# Patient Record
Sex: Female | Born: 1985 | Race: White | Hispanic: No | Marital: Single | Smoking: Never smoker
Health system: Southern US, Community
[De-identification: ages and names within clinical notes are randomized; demographics above are authoritative.]

## PROBLEM LIST (undated history)

## (undated) DIAGNOSIS — K589 Irritable bowel syndrome without diarrhea: Secondary | ICD-10-CM

## (undated) DIAGNOSIS — R519 Headache, unspecified: Secondary | ICD-10-CM

## (undated) DIAGNOSIS — F419 Anxiety disorder, unspecified: Secondary | ICD-10-CM

## (undated) DIAGNOSIS — I4581 Long QT syndrome: Secondary | ICD-10-CM

## (undated) DIAGNOSIS — E282 Polycystic ovarian syndrome: Secondary | ICD-10-CM

## (undated) DIAGNOSIS — I4719 Other supraventricular tachycardia: Secondary | ICD-10-CM

## (undated) HISTORY — DX: Irritable bowel syndrome, unspecified: K58.9

## (undated) HISTORY — DX: Anxiety disorder, unspecified: F41.9

## (undated) HISTORY — DX: Headache, unspecified: R51.9

## (undated) HISTORY — PX: LOOP RECORDER INSERTION: EP1214

## (undated) HISTORY — PX: OTHER SURGICAL HISTORY: SHX169

## (undated) HISTORY — DX: Other supraventricular tachycardia: I47.19

## (undated) HISTORY — DX: Polycystic ovarian syndrome: E28.2

## (undated) HISTORY — DX: Long QT syndrome: I45.81

---
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
## 2009-07-07 ENCOUNTER — Telehealth (INDEPENDENT_AMBULATORY_CARE_PROVIDER_SITE_OTHER): Payer: Self-pay | Admitting: *Deleted

## 2010-05-09 NOTE — Progress Notes (Signed)
  I faxed pt a ROI 3/30 she signed faxed back to me, I copied records called and let her know they were ready (12 lead,LOV) she will pick-up. Rebekah [name]  July 07, 2009 8:31 AM

## 2010-11-24 ENCOUNTER — Other Ambulatory Visit: Payer: Self-pay | Admitting: Family Medicine

## 2010-11-24 DIAGNOSIS — R51 Headache: Secondary | ICD-10-CM

## 2010-11-29 ENCOUNTER — Other Ambulatory Visit: Payer: Self-pay | Admitting: Family Medicine

## 2010-11-29 DIAGNOSIS — R51 Headache: Secondary | ICD-10-CM

## 2010-12-04 ENCOUNTER — Ambulatory Visit
Admission: RE | Admit: 2010-12-04 | Discharge: 2010-12-04 | Disposition: A | Discharge status: Home / Self Care | Payer: 59 | Source: Ambulatory Visit | Attending: Family Medicine | Admitting: Family Medicine

## 2010-12-04 DIAGNOSIS — R51 Headache: Secondary | ICD-10-CM

## 2010-12-04 MED ORDER — IOHEXOL 300 MG/ML  SOLN
75.0000 mL | Freq: Once | INTRAMUSCULAR | Status: AC | PRN
  Administered 2010-12-04: 75 mL via INTRAVENOUS

## 2010-12-15 ENCOUNTER — Other Ambulatory Visit: Payer: Self-pay | Admitting: Family Medicine

## 2010-12-15 DIAGNOSIS — R1013 Epigastric pain: Secondary | ICD-10-CM

## 2010-12-20 ENCOUNTER — Ambulatory Visit
Admission: RE | Admit: 2010-12-20 | Discharge: 2010-12-20 | Disposition: A | Discharge status: Home / Self Care | Payer: 59 | Source: Ambulatory Visit | Attending: Family Medicine | Admitting: Family Medicine

## 2010-12-20 DIAGNOSIS — R1013 Epigastric pain: Secondary | ICD-10-CM

## 2012-08-04 IMAGING — CT CT HEAD WO/W CM
1 of 2 series · 13 of 30 positions shown, 17 images · IV contrast (75CC OMNI 300)
Comparison: None

CLINICAL DATA: Frequent headaches.

CT HEAD WITHOUT AND WITH CONTRAST
TECHNIQUE: Contiguous axial images were obtained from the base of
the skull through the vertex without and with intravenous contrast.
Contrast: 75 ml [id number]

[Series 2: head w/o · axial · non-contrast · 0.49mm/px · z∈[+16,+139]mm · 13 of 28 slices shown, 17 images]
[im 2/28  brain]
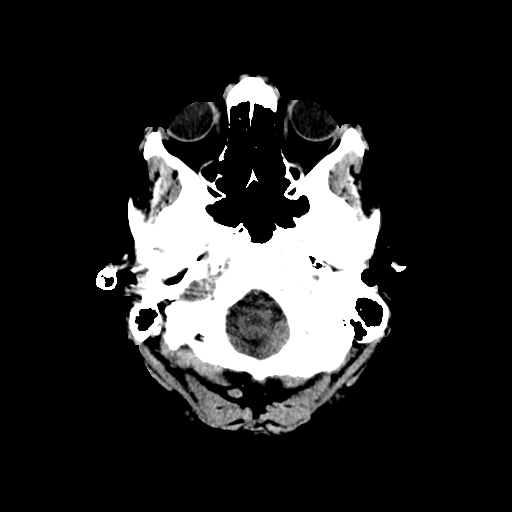
[im 2/28  bone]
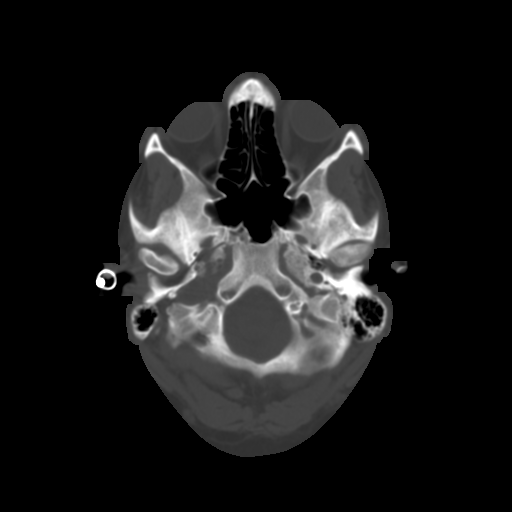
[im 4/28  brain]
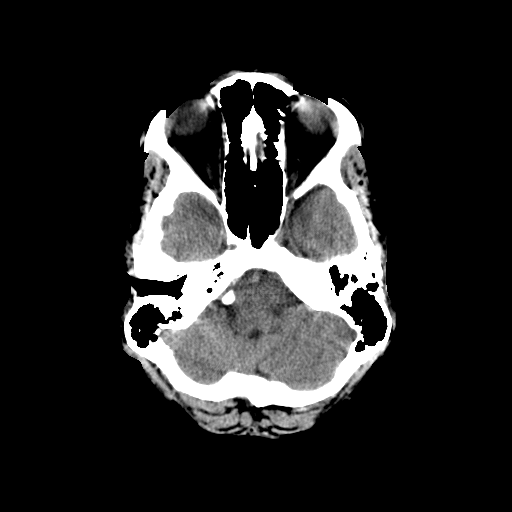
[im 6/28  brain]
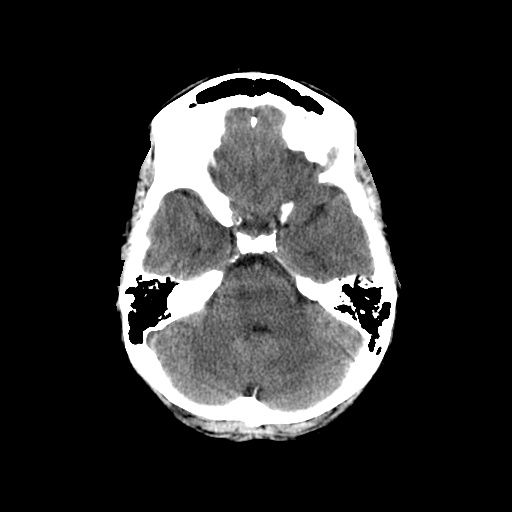
[im 8/28  brain]
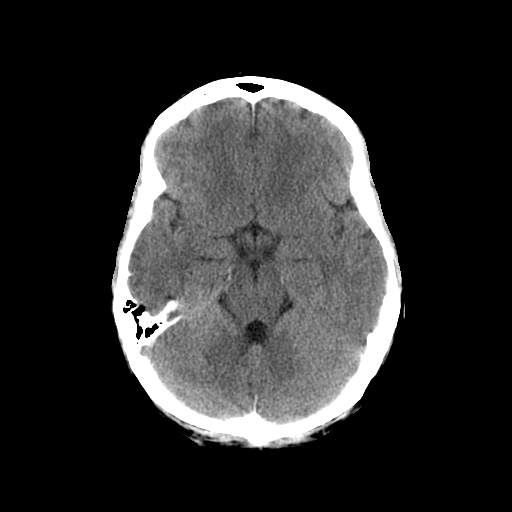
[im 10/28  brain]
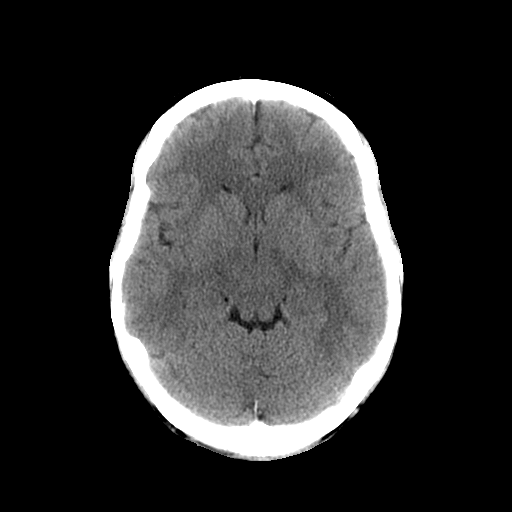
[im 10/28  bone]
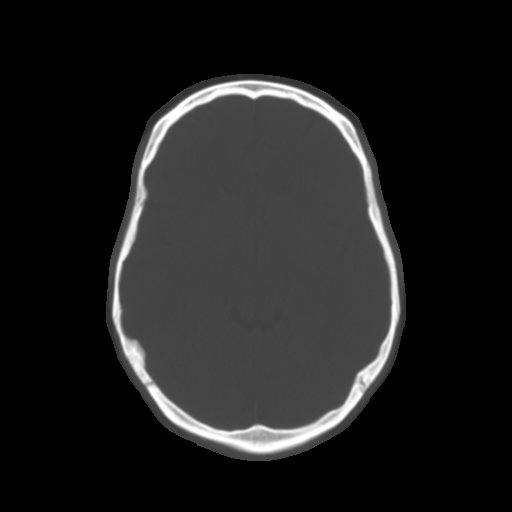
[im 12/28  brain]
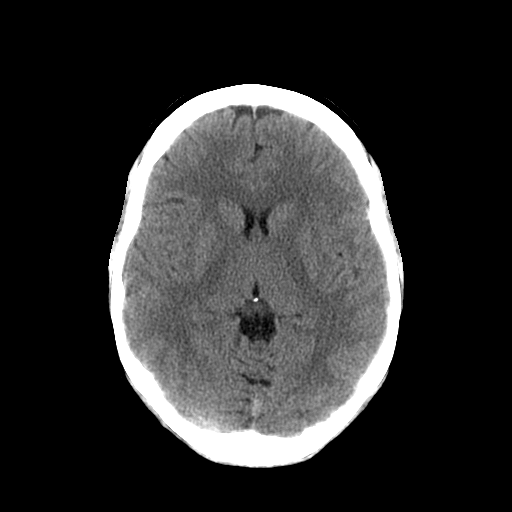
[im 14/28  brain]
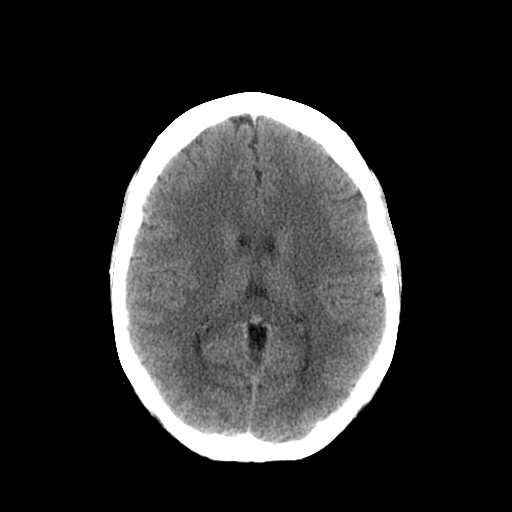
[im 16/28  brain]
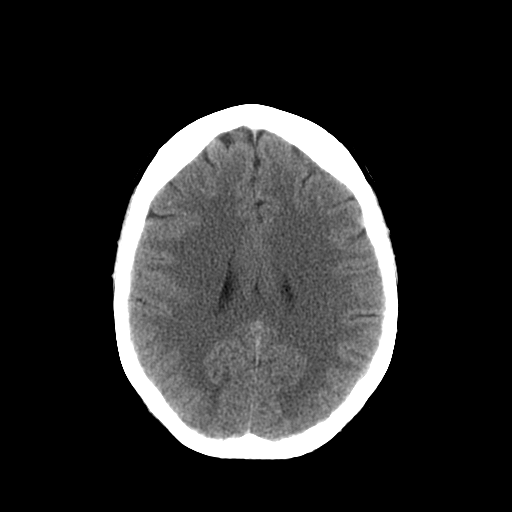
[im 18/28  brain]
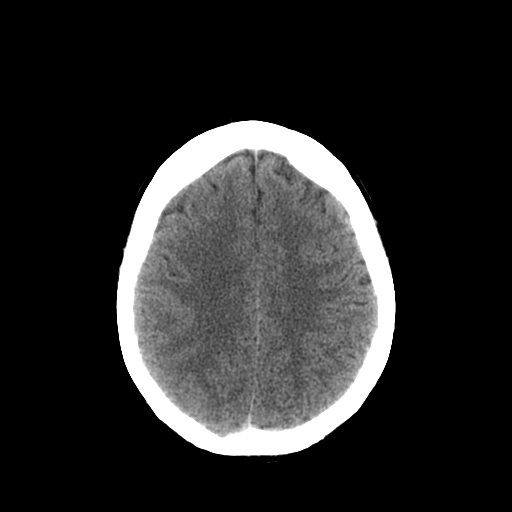
[im 18/28  bone]
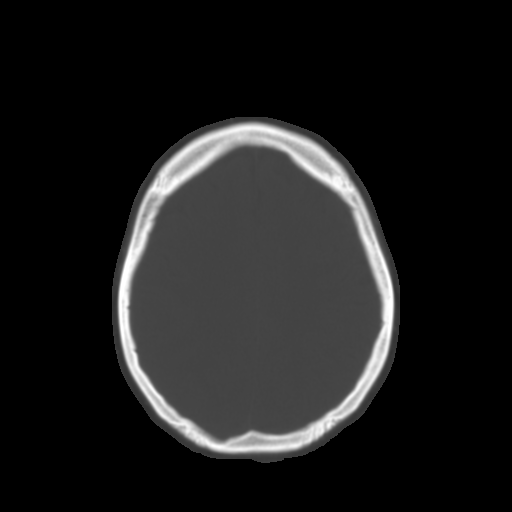
[im 20/28  brain]
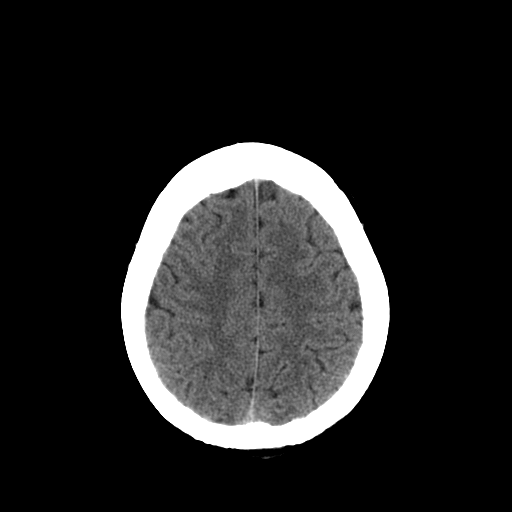
[im 22/28  brain]
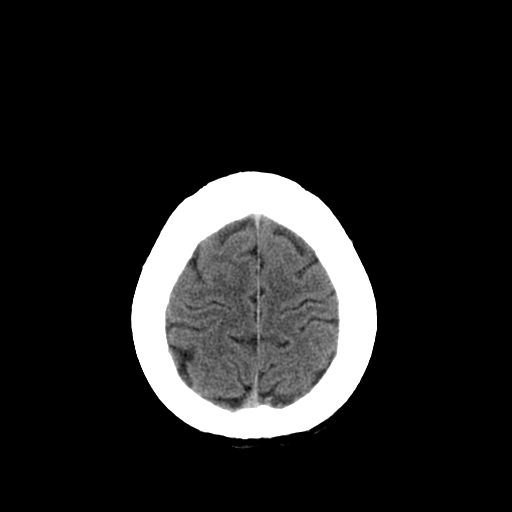
[im 24/28  brain]
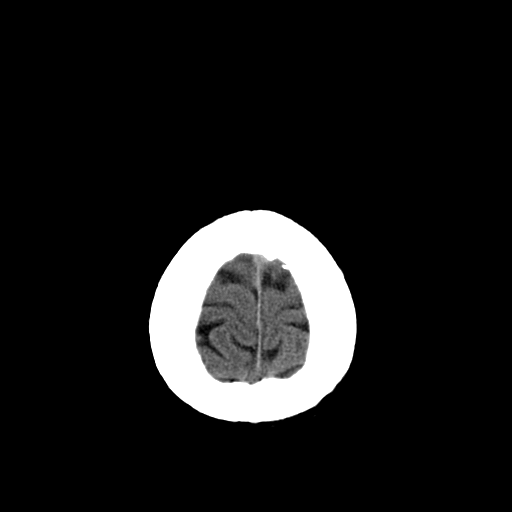
[im 26/28  brain]
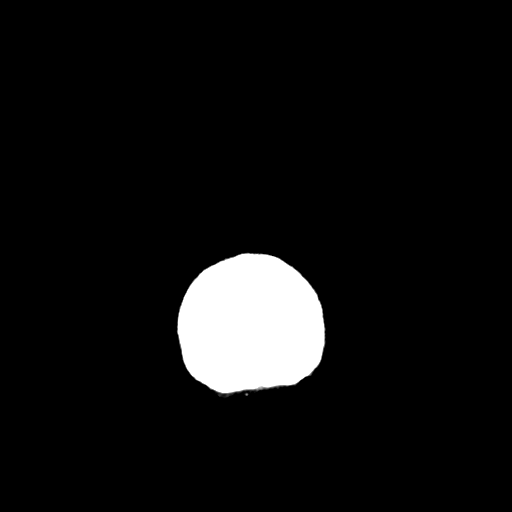
[im 26/28  bone]
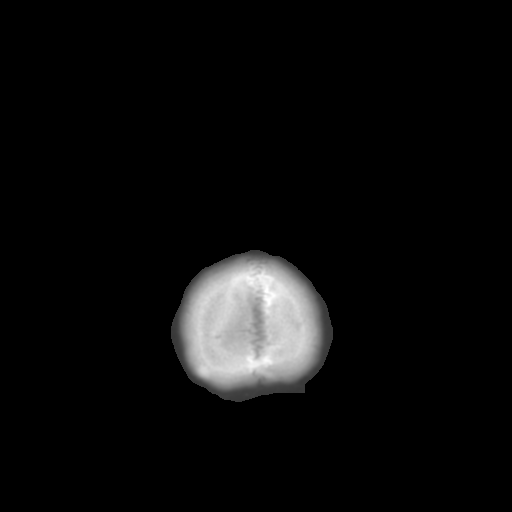

[13 of 30 positions shown; findings below may reference images not displayed]

FINDINGS: The ventricles are normal.  No extra-axial fluid
collections are seen.  The brainstem and cerebellum are
unremarkable.  No acute intracranial findings such as infarction or
hemorrhage.  No mass lesions.The major vascular structures appear
patent and normal including the dural venous sinuses.

The bony calvarium is intact.  The visualized paranasal sinuses and
mastoid air cells are clear.
IMPRESSION: Normal head CT.

## 2012-08-20 IMAGING — US US ABDOMEN COMPLETE
1 series · 14 of 25 positions shown · non-contrast
Comparison: None.

CLINICAL DATA: Dyspepsia.  Epigastric pain with nausea, vomiting
and diarrhea.

COMPLETE ABDOMINAL ULTRASOUND

[Series 1: us abdomen complete · 0.29mm/px · 14 of 86 slices shown]
[im 1/86]
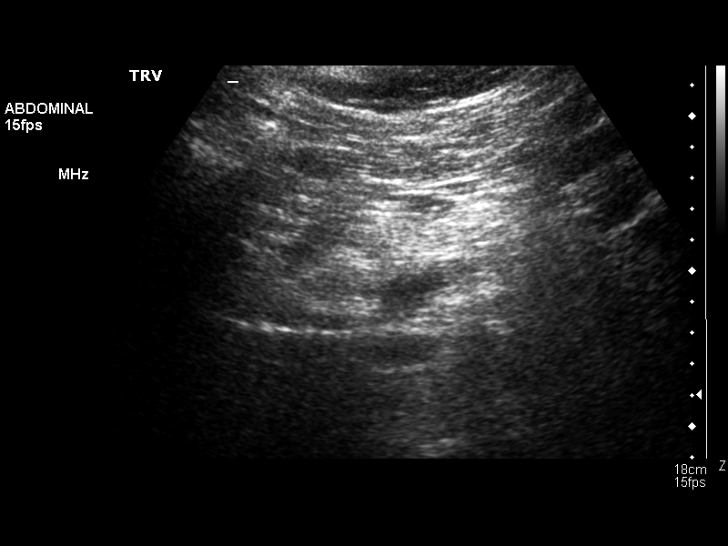
[im 8/86]
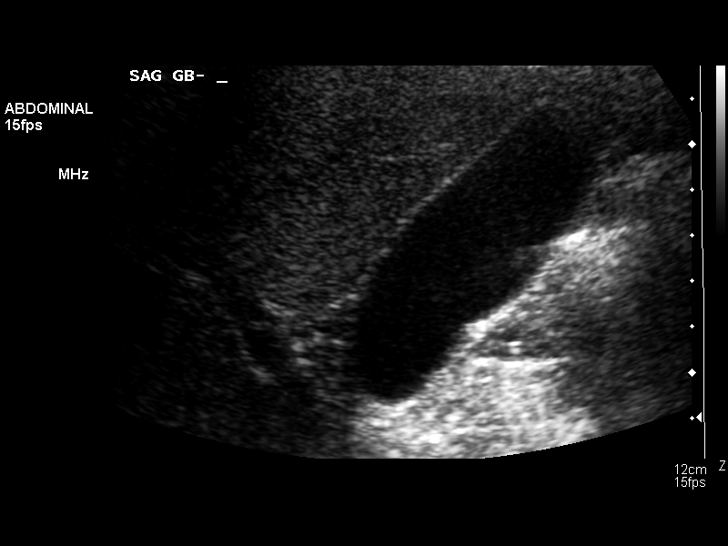
[im 15/86]
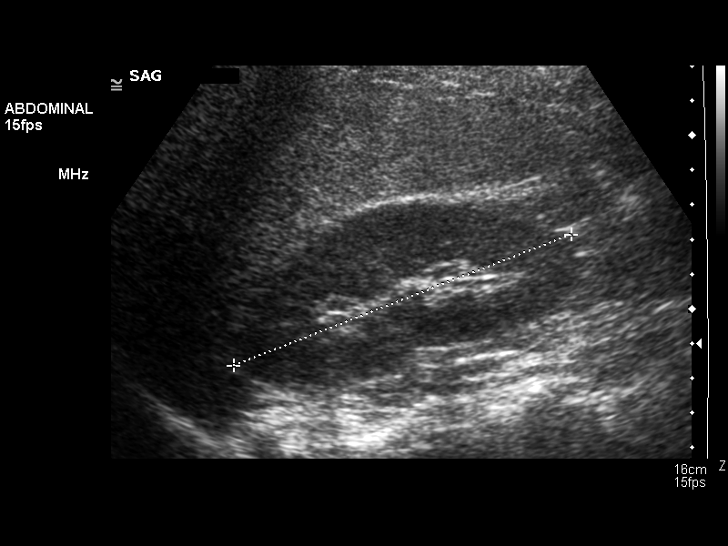
[im 22/86]
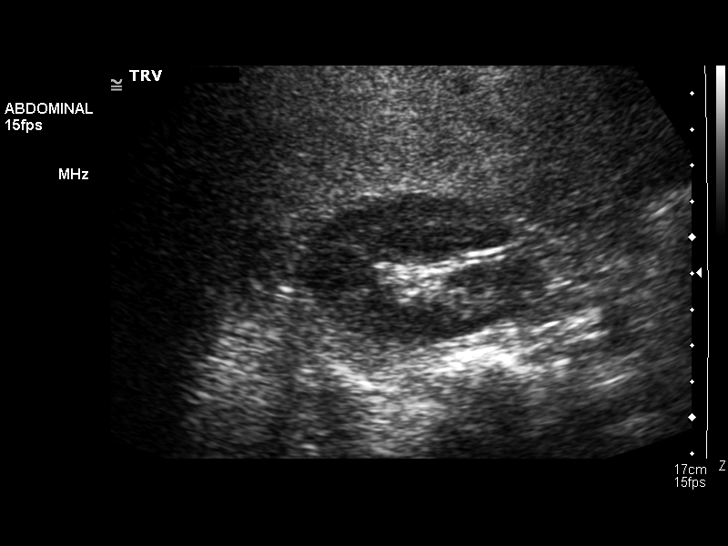
[im 29/86]
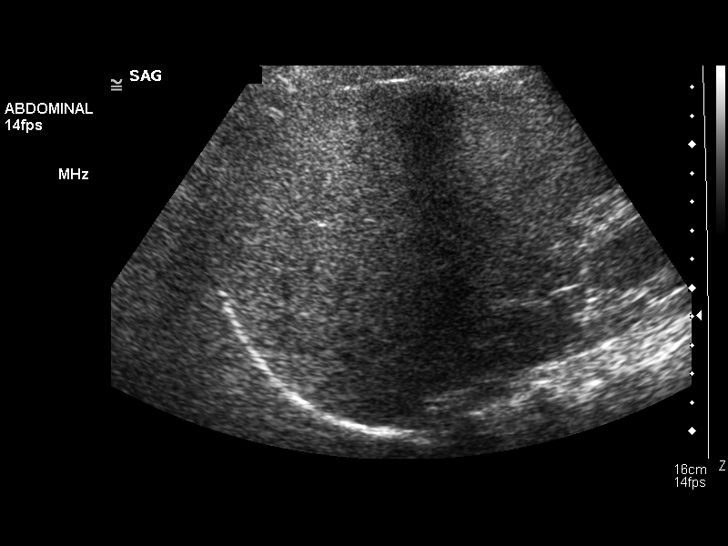
[im 32/86]
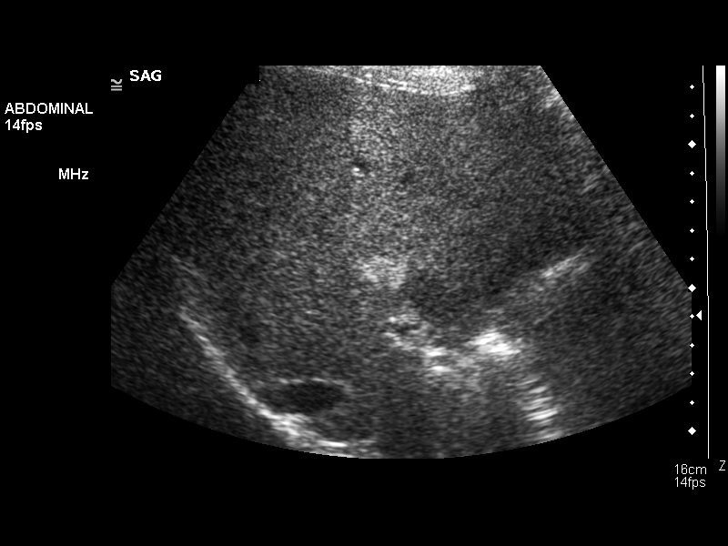
[im 39/86]
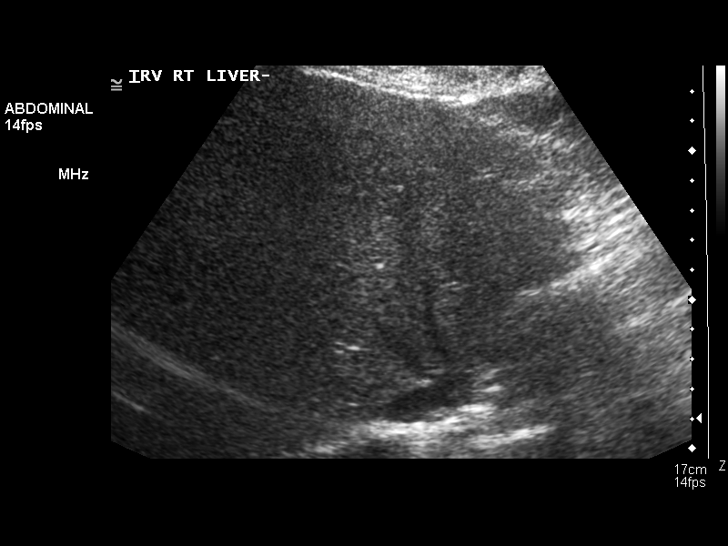
[im 47/86]
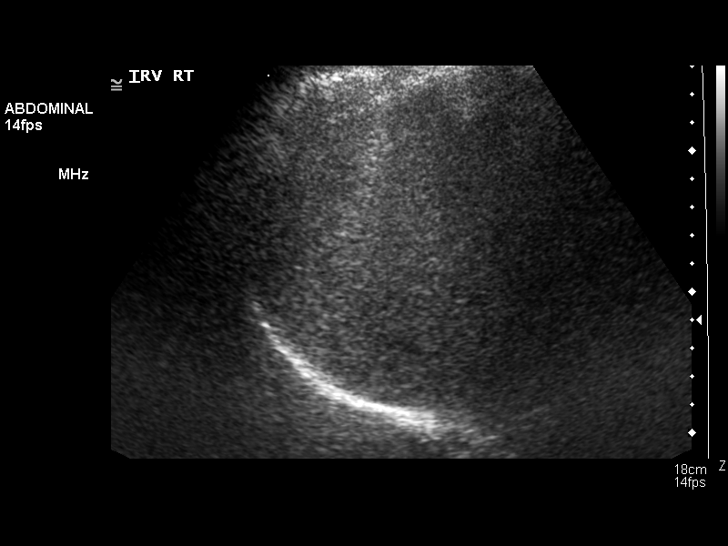
[im 54/86]
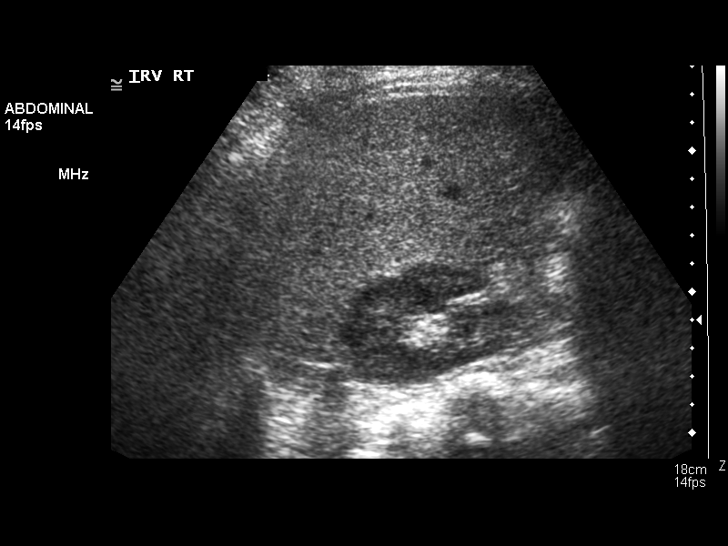
[im 57/86]
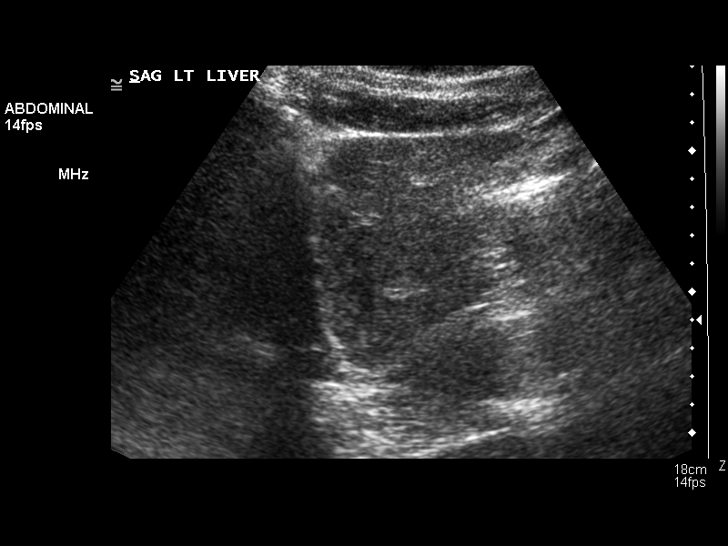
[im 64/86]
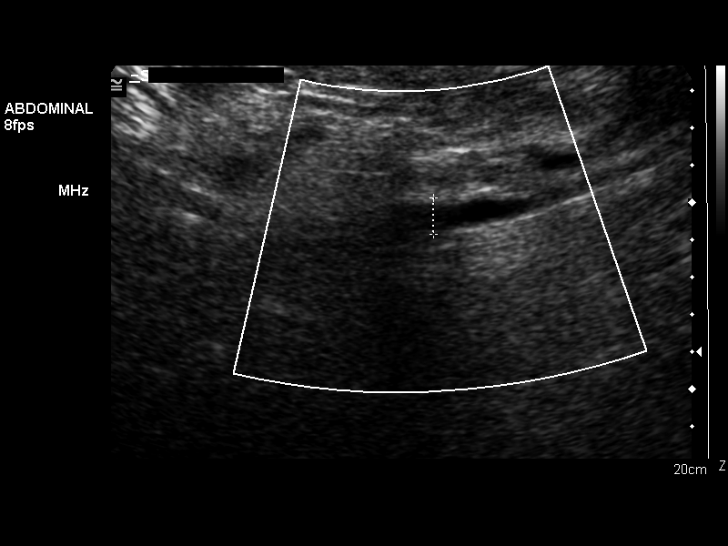
[im 71/86]
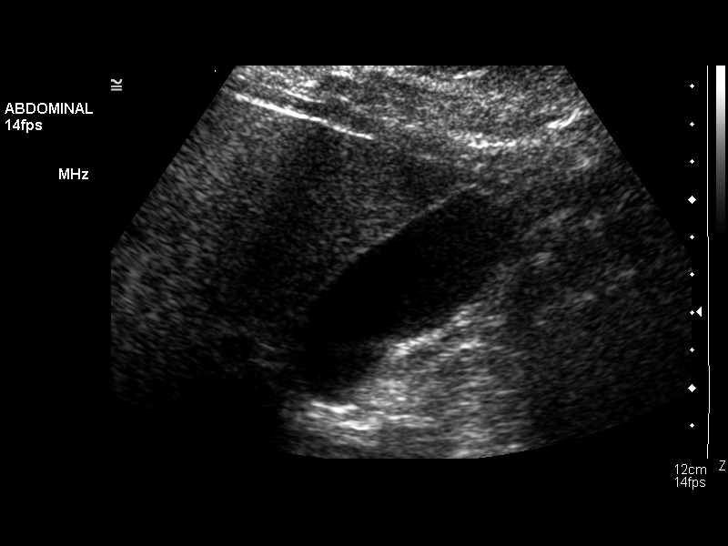
[im 78/86]
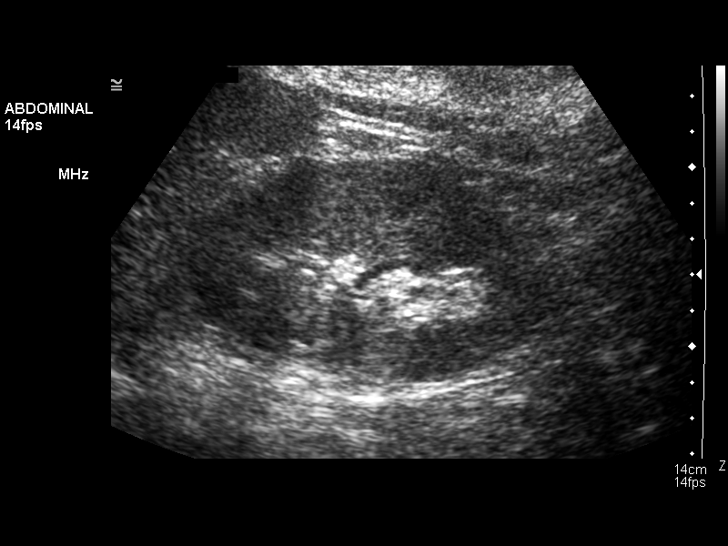
[im 86/86]
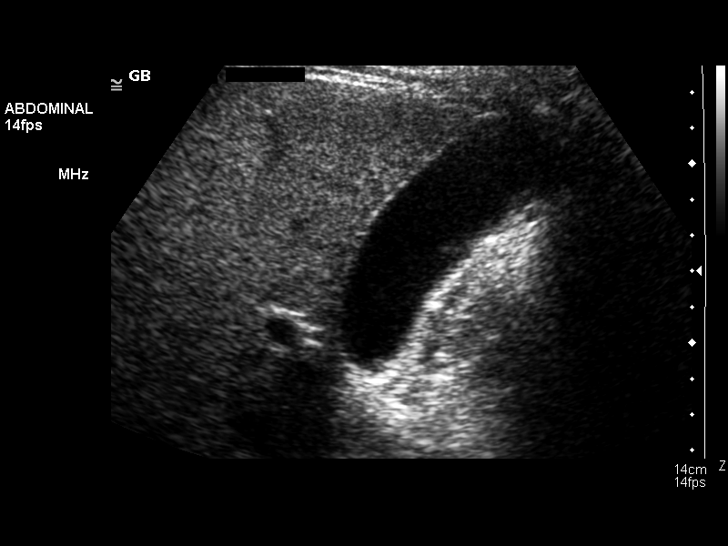

[14 of 25 positions shown; findings below may reference images not displayed]

FINDINGS: Gallbladder:  No gallstones, gallbladder wall thickening, or
pericholecystic fluid.

Common bile duct:  Measures 3 mm, within normal limits.

Liver:  Heterogeneous in echotexture.  In the right hepatic lobe,
there is a 1.8 x 1.2 x 2.2 cm hyperechoic lesion.  A focal area of
decreased echogenicity in the right hepatic lobe measures 2.2 cm
(image 50).

IVC:  Appears normal.

Pancreas:  Visualization is limited by bowel gas.

Spleen:  Measures 9.7 cm, negative.

Right Kidney:  Measures 11.0 cm with uniform parenchymal echo
texture.  No hydronephrosis.

Left Kidney:  Measures 12.0 cm with uniform parenchymal echo
texture.  No hydronephrosis.

Abdominal aorta:  Proximal aorta and common iliac arteries are
obscured by bowel gas.  Otherwise nonaneurysmal.
IMPRESSION: Hepatic steatosis with a probable right hepatic lobe hemangioma and
focal area of fatty sparing.

## 2014-09-13 ENCOUNTER — Other Ambulatory Visit (HOSPITAL_COMMUNITY)
Admission: RE | Admit: 2014-09-13 | Discharge: 2014-09-13 | Disposition: A | Discharge status: Home / Self Care | Payer: BLUE CROSS/BLUE SHIELD | Source: Ambulatory Visit | Attending: Family Medicine | Admitting: Family Medicine

## 2014-09-13 ENCOUNTER — Other Ambulatory Visit: Payer: Self-pay | Admitting: Family Medicine

## 2014-09-13 DIAGNOSIS — Z124 Encounter for screening for malignant neoplasm of cervix: Secondary | ICD-10-CM | POA: Insufficient documentation

## 2014-09-16 LAB — CYTOLOGY - PAP

## 2014-12-24 ENCOUNTER — Ambulatory Visit: Payer: BLUE CROSS/BLUE SHIELD

## 2018-12-29 ENCOUNTER — Other Ambulatory Visit (HOSPITAL_COMMUNITY)
Admission: RE | Admit: 2018-12-29 | Discharge: 2018-12-29 | Disposition: A | Discharge status: Home / Self Care | Payer: No Typology Code available for payment source | Source: Ambulatory Visit | Attending: Family Medicine | Admitting: Family Medicine

## 2018-12-29 ENCOUNTER — Other Ambulatory Visit: Payer: Self-pay | Admitting: Family Medicine

## 2018-12-29 DIAGNOSIS — Z124 Encounter for screening for malignant neoplasm of cervix: Secondary | ICD-10-CM | POA: Diagnosis present

## 2019-01-01 LAB — CYTOLOGY - PAP: Diagnosis: NEGATIVE

## 2021-02-07 ENCOUNTER — Other Ambulatory Visit: Payer: Self-pay | Admitting: Family Medicine

## 2021-02-07 DIAGNOSIS — N6321 Unspecified lump in the left breast, upper outer quadrant: Secondary | ICD-10-CM

## 2021-02-10 ENCOUNTER — Ambulatory Visit
Admission: RE | Admit: 2021-02-10 | Discharge: 2021-02-10 | Disposition: A | Discharge status: Home / Self Care | Payer: No Typology Code available for payment source | Source: Ambulatory Visit | Attending: Family Medicine | Admitting: Family Medicine

## 2021-02-10 ENCOUNTER — Other Ambulatory Visit: Payer: Self-pay

## 2021-02-10 DIAGNOSIS — N6321 Unspecified lump in the left breast, upper outer quadrant: Secondary | ICD-10-CM

## 2021-10-03 LAB — OB RESULTS CONSOLE RPR: RPR: NONREACTIVE

## 2021-10-03 LAB — OB RESULTS CONSOLE HIV ANTIBODY (ROUTINE TESTING): HIV: NONREACTIVE

## 2021-10-03 LAB — OB RESULTS CONSOLE HEPATITIS B SURFACE ANTIGEN: Hepatitis B Surface Ag: NEGATIVE

## 2021-10-03 LAB — OB RESULTS CONSOLE RUBELLA ANTIBODY, IGM: Rubella: IMMUNE

## 2021-10-04 LAB — OB RESULTS CONSOLE GC/CHLAMYDIA: Chlamydia: NEGATIVE

## 2022-01-23 ENCOUNTER — Encounter: Payer: Self-pay | Admitting: *Deleted

## 2022-01-23 ENCOUNTER — Other Ambulatory Visit: Payer: Self-pay | Admitting: Obstetrics and Gynecology

## 2022-01-23 DIAGNOSIS — Z363 Encounter for antenatal screening for malformations: Secondary | ICD-10-CM

## 2022-01-24 ENCOUNTER — Encounter: Payer: Self-pay | Admitting: *Deleted

## 2022-01-24 ENCOUNTER — Ambulatory Visit: Payer: PRIVATE HEALTH INSURANCE | Attending: Obstetrics and Gynecology

## 2022-01-24 ENCOUNTER — Ambulatory Visit: Payer: PRIVATE HEALTH INSURANCE | Admitting: *Deleted

## 2022-01-24 ENCOUNTER — Other Ambulatory Visit: Payer: Self-pay | Admitting: *Deleted

## 2022-01-24 DIAGNOSIS — Z3A26 26 weeks gestation of pregnancy: Secondary | ICD-10-CM | POA: Insufficient documentation

## 2022-01-24 DIAGNOSIS — Z363 Encounter for antenatal screening for malformations: Secondary | ICD-10-CM | POA: Diagnosis not present

## 2022-01-24 DIAGNOSIS — O368320 Maternal care for abnormalities of the fetal heart rate or rhythm, second trimester, not applicable or unspecified: Secondary | ICD-10-CM | POA: Insufficient documentation

## 2022-01-24 DIAGNOSIS — O99212 Obesity complicating pregnancy, second trimester: Secondary | ICD-10-CM | POA: Insufficient documentation

## 2022-01-24 DIAGNOSIS — O09512 Supervision of elderly primigravida, second trimester: Secondary | ICD-10-CM

## 2022-01-24 DIAGNOSIS — O09522 Supervision of elderly multigravida, second trimester: Secondary | ICD-10-CM | POA: Insufficient documentation

## 2022-01-24 DIAGNOSIS — O09812 Supervision of pregnancy resulting from assisted reproductive technology, second trimester: Secondary | ICD-10-CM | POA: Insufficient documentation

## 2022-01-24 DIAGNOSIS — Z95818 Presence of other cardiac implants and grafts: Secondary | ICD-10-CM

## 2022-01-24 DIAGNOSIS — I4719 Other supraventricular tachycardia: Secondary | ICD-10-CM

## 2022-02-21 ENCOUNTER — Ambulatory Visit: Payer: PRIVATE HEALTH INSURANCE | Attending: Maternal & Fetal Medicine

## 2022-02-21 ENCOUNTER — Ambulatory Visit: Payer: PRIVATE HEALTH INSURANCE | Admitting: *Deleted

## 2022-02-21 ENCOUNTER — Other Ambulatory Visit: Payer: Self-pay | Admitting: *Deleted

## 2022-02-21 VITALS — BP 136/83 | HR 87

## 2022-02-21 DIAGNOSIS — O2693 Pregnancy related conditions, unspecified, third trimester: Secondary | ICD-10-CM

## 2022-02-21 DIAGNOSIS — O09813 Supervision of pregnancy resulting from assisted reproductive technology, third trimester: Secondary | ICD-10-CM

## 2022-02-21 DIAGNOSIS — Z3A30 30 weeks gestation of pregnancy: Secondary | ICD-10-CM | POA: Insufficient documentation

## 2022-02-21 DIAGNOSIS — O368390 Maternal care for abnormalities of the fetal heart rate or rhythm, unspecified trimester, not applicable or unspecified: Secondary | ICD-10-CM

## 2022-02-21 DIAGNOSIS — Z3689 Encounter for other specified antenatal screening: Secondary | ICD-10-CM

## 2022-02-21 DIAGNOSIS — I4719 Other supraventricular tachycardia: Secondary | ICD-10-CM

## 2022-02-21 DIAGNOSIS — O09512 Supervision of elderly primigravida, second trimester: Secondary | ICD-10-CM | POA: Diagnosis not present

## 2022-02-21 DIAGNOSIS — O09523 Supervision of elderly multigravida, third trimester: Secondary | ICD-10-CM

## 2022-02-21 DIAGNOSIS — Z95818 Presence of other cardiac implants and grafts: Secondary | ICD-10-CM

## 2022-02-21 DIAGNOSIS — O99213 Obesity complicating pregnancy, third trimester: Secondary | ICD-10-CM

## 2022-02-21 DIAGNOSIS — O359XX0 Maternal care for (suspected) fetal abnormality and damage, unspecified, not applicable or unspecified: Secondary | ICD-10-CM

## 2022-02-21 DIAGNOSIS — O09513 Supervision of elderly primigravida, third trimester: Secondary | ICD-10-CM | POA: Diagnosis not present

## 2022-02-26 ENCOUNTER — Other Ambulatory Visit: Payer: Self-pay | Admitting: *Deleted

## 2022-02-26 DIAGNOSIS — I4719 Other supraventricular tachycardia: Secondary | ICD-10-CM

## 2022-02-26 DIAGNOSIS — O368390 Maternal care for abnormalities of the fetal heart rate or rhythm, unspecified trimester, not applicable or unspecified: Secondary | ICD-10-CM

## 2022-02-26 DIAGNOSIS — O99213 Obesity complicating pregnancy, third trimester: Secondary | ICD-10-CM

## 2022-02-26 DIAGNOSIS — O09513 Supervision of elderly primigravida, third trimester: Secondary | ICD-10-CM

## 2022-03-06 ENCOUNTER — Ambulatory Visit: Payer: PRIVATE HEALTH INSURANCE | Admitting: *Deleted

## 2022-03-06 ENCOUNTER — Ambulatory Visit (HOSPITAL_BASED_OUTPATIENT_CLINIC_OR_DEPARTMENT_OTHER): Payer: PRIVATE HEALTH INSURANCE | Admitting: Maternal & Fetal Medicine

## 2022-03-06 ENCOUNTER — Encounter: Payer: Self-pay | Admitting: *Deleted

## 2022-03-06 ENCOUNTER — Ambulatory Visit: Payer: PRIVATE HEALTH INSURANCE | Attending: Maternal & Fetal Medicine | Admitting: *Deleted

## 2022-03-06 DIAGNOSIS — O99213 Obesity complicating pregnancy, third trimester: Secondary | ICD-10-CM | POA: Insufficient documentation

## 2022-03-06 DIAGNOSIS — O99413 Diseases of the circulatory system complicating pregnancy, third trimester: Secondary | ICD-10-CM

## 2022-03-06 DIAGNOSIS — O09523 Supervision of elderly multigravida, third trimester: Secondary | ICD-10-CM | POA: Diagnosis not present

## 2022-03-06 DIAGNOSIS — I519 Heart disease, unspecified: Secondary | ICD-10-CM

## 2022-03-06 DIAGNOSIS — E669 Obesity, unspecified: Secondary | ICD-10-CM | POA: Diagnosis present

## 2022-03-06 DIAGNOSIS — Z3A31 31 weeks gestation of pregnancy: Secondary | ICD-10-CM

## 2022-03-06 DIAGNOSIS — O09813 Supervision of pregnancy resulting from assisted reproductive technology, third trimester: Secondary | ICD-10-CM

## 2022-03-06 DIAGNOSIS — O368390 Maternal care for abnormalities of the fetal heart rate or rhythm, unspecified trimester, not applicable or unspecified: Secondary | ICD-10-CM

## 2022-03-06 NOTE — Procedures (Signed)
Rebekah [name] [birth year] [redacted]w[redacted]d  Fetus A Non-Stress Test Interpretation for 03/06/22  Indication:  AMA, IVF, obesity  Fetal Heart Rate A Mode: External Baseline Rate (A): 135 bpm Variability: Moderate Accelerations: 15 x 15 Decelerations: None Multiple birth?: No  Uterine Activity Mode: Toco Contraction Frequency (min): None  Interpretation (Fetal Testing) Nonstress Test Interpretation: Reactive Comments: Dr. Darra Lis reviewed tracing.

## 2022-03-06 NOTE — Progress Notes (Unsigned)
MFM Consult Note Patient Name: Rebekah [name]  Patient MRN:   [id number]  Referring provider: [facility]  Reason for Consult: Maternal prolonged QT, fm hx of sudden death (father, age 35), two second deg relatives with prolonged QT   HPI: Rebekah [name] is a 36 y.o. G1P0 at [redacted]w[redacted]d here for an NST and MFM consult.   [name] had a cardiac workup 12 years ago due to a aunt with long QT and recurrent syncope requiring defib. She has multiple familiy members with her moms side (aunt, second and third cousins) have prolonged QT. They have not had genetic testing. Her father also died in his sleep at age 7 but had no testing. [name] had recurrent syncope in highs school and had a borderline prolonged QT. She was hospitalized in college for a week with atrial ectopia and syncope. She then had an EP study showing an area outside the SA node that was abnormal but is too close to the SA node to ablate. She also had a prolonged QT with epinephrine confirming the diagnosis. She underwent genetic testing and had a variant of unknown significance. She has a loop recorder but no defibrillator. She has not had any episodes of V-tac or V-fib since having this. 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 She has not had an echo in 10 years or so.   Prior to pregnancy she was taking Synthroid at 25 mcg to have a TSH below 2.5 (was 2.53). She has d/c since becoming pregnancy and seeing our office.   Review of Systems: A review of systems was performed and was negative except per HPI   Past Obstetrical History:  OB History  Gravida Para Term Preterm AB Living  1            SAB IAB Ectopic Multiple Live Births               # Outcome Date GA Lbr Len/2nd Weight Sex Delivery Anes PTL Lv  1 Current              Past Gynecologic History:  Not discussed  Past Medical History:  Past Medical History:  Diagnosis Date   Anxiety    Atrial tachycardia     Headache    IBS (irritable bowel syndrome)    Long Q-T syndrome    PCOS (polycystic ovarian syndrome)        Past Surgical History:    Past Surgical History:  Procedure Laterality Date   ivf egg retrieval     LOOP RECORDER INSERTION       Family History:   family history includes Arthritis in her mother; Heart disease in her maternal aunt; Hypertension in her maternal aunt.    Social History:   Social History   Socioeconomic History   Marital status: Single    Spouse name: Not on file   Number of children: Not on file   Years of education: Not on file   Highest education level: Not on file  Occupational History   Not on file  Tobacco Use   Smoking status: Never   Smokeless tobacco: Never  Vaping Use   Vaping Use: Never used  Substance and Sexual Activity   Alcohol use: Not Currently   Drug use: Never   Sexual activity: Not on file  Other Topics Concern   Not on file  Social History Narrative   Not on file  Social Determinants of Health   Financial Resource Strain: Not on file  Food Insecurity: Not on file  Transportation Needs: Not on file  Physical Activity: Not on file  Stress: Not on file  Social Connections: Not on file  Intimate Partner Violence: Not on file      Home Medications:   Current Outpatient Medications on File Prior to Visit  Medication Sig Dispense Refill   aspirin EC 81 MG tablet Take 81 mg by mouth daily. Swallow whole.     famotidine (PEPCID) 40 MG tablet Take 40 mg by mouth daily.     levothyroxine (SYNTHROID) 25 MCG tablet Take 25 mcg by mouth daily before breakfast.     metoprolol tartrate (LOPRESSOR) 50 MG tablet Take 50 mg by mouth 2 (two) times daily.     Prenatal Vit-Fe Fumarate-FA (PRENATAL MULTIVITAMIN) TABS tablet Take 1 tablet by mouth daily at 12 noon.     VITAMIN D PO Take 5,000 Units by mouth daily.     No current facility-administered medications on file prior to visit.      Allergies:   No Known Allergies    Physical Exam:   See intake sheet for vitals Sitting comfortably on the sonogram table Nonlabored breathing Normal rate and rhythm Abdomen is nontender  Assessment  Rebekah [name] is a 35 y.o. G1P0 at [redacted]w[redacted]d by *** with pregnancy complicated by the following conditions:   1. Prolongeed QT syundrome I discussed the clinical implications of prolonged QT syndrome during pregnancy.  Gust the importance of following closely with her OB provider, cardiologist and MFM.  We discussed that serial growth ultrasounds did not.  We also discussed that a baseline echocardiogram is a good idea in the event she were to complications during her labor and delivery.  Also discussed potentially switching from metoprolol to propranolol improved outcomes during pregnancy in patients with a prolonged QT syndrome.  We discussed the timing and route of delivery typically is not altered unless she is having concerning symptoms or episodes of arrhythmia noted on her loop recorder.  At this time she is doing well and I think a vaginal delivery around 39 weeks is appropriate.  We also discussed the importance of meeting with anesthesia for consultation. In order to avoid life-threatening arrhythmias in LQTS patients, sympathetic activity should be minimized. It is reported that this goal can be achieved with combined spinal-epidural anesthesia. Spinal anesthesia alone is not administered for LQTS patients, as it leads to sudden hemodynamic changes and increased risk of arrythmnia. QT-interval prolonging drugs and safety data should be checked carefully during pregnancy. Pregnant women suffering from hyperemesis should be closely monitored for electrolyte imbalance, such as hypokalemia and hypomagnesemia. Usage of antiemetic medications may also prolong the QT interval. In high-risk LQTS (those with symptoms or arrhythmias currently), Caesarean delivery is recommended, whereas in low-risk or medium-risk LQTS, the mode of delivery  is advised by obstetricians. In the absence of obstetric contraindications, vaginal delivery is considered the safest mode of delivery.  Postpartum.  Poses the highest risk for development of an arrhythmia due to the hemodynamic shifts after delivery.  Close monitoring with anesthesia and OB should be done as well as trying to maintain euvolemia normal vital signs.  After delivery the patient's cardiologist has recommended that the newborn undergo an EKG and echocardiogram in the newborn nursery.  Prolonged QT syndrome can be genetically inherited and genetic workup can be considered as well.  Per her report there will be serial outpatient evaluations to  assess the neonatal long QT syndrome.  Recommendations -Serial growth ultrasounds -Fetal echo as been completed and is normal except for periods of bradycardia which can be a sign of fetal prolonged QT -Baseline echo should be done in the event she has a cardiac event for comparison -Continue f/u with cardiology  -Antenatal testing to start around 32 weeks for IVF -Anesthesia consult to be done in the next few weeks, referring provider to order -Consider combined spinal-epidural and maintain euvolemic during labor, delivery and postpartum -Close monitoring of hemodynamics postpartum as this is the highest risk for arrhythmias -Avoid medications that prolong the QT (common ones being antibiotics and antiemetics).  -Delivery around 39 weeks or sooner if indicated -Delivery route should be vaginal unless she develops concerning symptoms during labor, then cesarean would be indicated  Thank you for the opportunity to be involved with this patient's care. Please let us know if we can be of any further assistance.   Valeda Malm  MFM, [facility]   03/06/2022  11:13 AM

## 2022-03-13 ENCOUNTER — Ambulatory Visit: Payer: PRIVATE HEALTH INSURANCE | Admitting: *Deleted

## 2022-03-13 ENCOUNTER — Ambulatory Visit: Payer: PRIVATE HEALTH INSURANCE | Attending: Maternal & Fetal Medicine

## 2022-03-13 ENCOUNTER — Other Ambulatory Visit: Payer: Self-pay | Admitting: *Deleted

## 2022-03-13 VITALS — BP 121/70 | HR 81

## 2022-03-13 DIAGNOSIS — O99413 Diseases of the circulatory system complicating pregnancy, third trimester: Secondary | ICD-10-CM | POA: Diagnosis not present

## 2022-03-13 DIAGNOSIS — Z3A32 32 weeks gestation of pregnancy: Secondary | ICD-10-CM

## 2022-03-13 DIAGNOSIS — O09813 Supervision of pregnancy resulting from assisted reproductive technology, third trimester: Secondary | ICD-10-CM

## 2022-03-13 DIAGNOSIS — I4719 Other supraventricular tachycardia: Secondary | ICD-10-CM | POA: Insufficient documentation

## 2022-03-13 DIAGNOSIS — E669 Obesity, unspecified: Secondary | ICD-10-CM

## 2022-03-13 DIAGNOSIS — O99213 Obesity complicating pregnancy, third trimester: Secondary | ICD-10-CM | POA: Diagnosis present

## 2022-03-13 DIAGNOSIS — O09523 Supervision of elderly multigravida, third trimester: Secondary | ICD-10-CM

## 2022-03-13 DIAGNOSIS — O368390 Maternal care for abnormalities of the fetal heart rate or rhythm, unspecified trimester, not applicable or unspecified: Secondary | ICD-10-CM | POA: Insufficient documentation

## 2022-03-13 DIAGNOSIS — O359XX0 Maternal care for (suspected) fetal abnormality and damage, unspecified, not applicable or unspecified: Secondary | ICD-10-CM

## 2022-03-20 ENCOUNTER — Ambulatory Visit: Payer: PRIVATE HEALTH INSURANCE | Admitting: *Deleted

## 2022-03-20 ENCOUNTER — Ambulatory Visit: Payer: PRIVATE HEALTH INSURANCE | Attending: Maternal & Fetal Medicine

## 2022-03-20 VITALS — BP 139/73 | HR 84

## 2022-03-20 DIAGNOSIS — O09813 Supervision of pregnancy resulting from assisted reproductive technology, third trimester: Secondary | ICD-10-CM | POA: Insufficient documentation

## 2022-03-20 DIAGNOSIS — I4719 Other supraventricular tachycardia: Secondary | ICD-10-CM | POA: Diagnosis not present

## 2022-03-20 DIAGNOSIS — O09523 Supervision of elderly multigravida, third trimester: Secondary | ICD-10-CM | POA: Diagnosis not present

## 2022-03-20 DIAGNOSIS — Z3A33 33 weeks gestation of pregnancy: Secondary | ICD-10-CM

## 2022-03-20 DIAGNOSIS — O368390 Maternal care for abnormalities of the fetal heart rate or rhythm, unspecified trimester, not applicable or unspecified: Secondary | ICD-10-CM | POA: Diagnosis not present

## 2022-03-20 DIAGNOSIS — O99413 Diseases of the circulatory system complicating pregnancy, third trimester: Secondary | ICD-10-CM | POA: Diagnosis not present

## 2022-03-20 DIAGNOSIS — E669 Obesity, unspecified: Secondary | ICD-10-CM

## 2022-03-20 DIAGNOSIS — O99213 Obesity complicating pregnancy, third trimester: Secondary | ICD-10-CM | POA: Insufficient documentation

## 2022-03-20 DIAGNOSIS — O09513 Supervision of elderly primigravida, third trimester: Secondary | ICD-10-CM | POA: Diagnosis present

## 2022-03-27 ENCOUNTER — Encounter: Payer: Self-pay | Admitting: *Deleted

## 2022-03-27 ENCOUNTER — Ambulatory Visit: Payer: PRIVATE HEALTH INSURANCE | Attending: Maternal & Fetal Medicine

## 2022-03-27 ENCOUNTER — Ambulatory Visit: Payer: PRIVATE HEALTH INSURANCE | Admitting: *Deleted

## 2022-03-27 DIAGNOSIS — I4719 Other supraventricular tachycardia: Secondary | ICD-10-CM

## 2022-03-27 DIAGNOSIS — O09813 Supervision of pregnancy resulting from assisted reproductive technology, third trimester: Secondary | ICD-10-CM | POA: Insufficient documentation

## 2022-03-27 DIAGNOSIS — O09523 Supervision of elderly multigravida, third trimester: Secondary | ICD-10-CM | POA: Diagnosis not present

## 2022-03-27 DIAGNOSIS — O368330 Maternal care for abnormalities of the fetal heart rate or rhythm, third trimester, not applicable or unspecified: Secondary | ICD-10-CM | POA: Diagnosis not present

## 2022-03-27 DIAGNOSIS — O99413 Diseases of the circulatory system complicating pregnancy, third trimester: Secondary | ICD-10-CM

## 2022-03-27 DIAGNOSIS — Z3A34 34 weeks gestation of pregnancy: Secondary | ICD-10-CM

## 2022-03-27 DIAGNOSIS — O99213 Obesity complicating pregnancy, third trimester: Secondary | ICD-10-CM | POA: Insufficient documentation

## 2022-03-27 DIAGNOSIS — E669 Obesity, unspecified: Secondary | ICD-10-CM

## 2022-04-05 ENCOUNTER — Ambulatory Visit: Payer: PRIVATE HEALTH INSURANCE | Admitting: *Deleted

## 2022-04-05 ENCOUNTER — Ambulatory Visit: Payer: PRIVATE HEALTH INSURANCE | Attending: Maternal & Fetal Medicine

## 2022-04-05 VITALS — BP 137/76 | HR 81

## 2022-04-05 DIAGNOSIS — O09513 Supervision of elderly primigravida, third trimester: Secondary | ICD-10-CM

## 2022-04-05 DIAGNOSIS — O99213 Obesity complicating pregnancy, third trimester: Secondary | ICD-10-CM | POA: Diagnosis not present

## 2022-04-05 DIAGNOSIS — O99413 Diseases of the circulatory system complicating pregnancy, third trimester: Secondary | ICD-10-CM

## 2022-04-05 DIAGNOSIS — E669 Obesity, unspecified: Secondary | ICD-10-CM | POA: Diagnosis not present

## 2022-04-05 DIAGNOSIS — O09813 Supervision of pregnancy resulting from assisted reproductive technology, third trimester: Secondary | ICD-10-CM

## 2022-04-05 DIAGNOSIS — Z3A36 36 weeks gestation of pregnancy: Secondary | ICD-10-CM

## 2022-04-05 DIAGNOSIS — O09523 Supervision of elderly multigravida, third trimester: Secondary | ICD-10-CM | POA: Insufficient documentation

## 2022-04-05 DIAGNOSIS — O359XX0 Maternal care for (suspected) fetal abnormality and damage, unspecified, not applicable or unspecified: Secondary | ICD-10-CM

## 2022-04-05 DIAGNOSIS — I4719 Other supraventricular tachycardia: Secondary | ICD-10-CM

## 2022-04-05 LAB — OB RESULTS CONSOLE GBS: GBS: NEGATIVE

## 2022-04-10 ENCOUNTER — Ambulatory Visit: Payer: PRIVATE HEALTH INSURANCE | Attending: Maternal & Fetal Medicine | Admitting: *Deleted

## 2022-04-10 ENCOUNTER — Ambulatory Visit (HOSPITAL_BASED_OUTPATIENT_CLINIC_OR_DEPARTMENT_OTHER): Payer: PRIVATE HEALTH INSURANCE | Admitting: *Deleted

## 2022-04-10 VITALS — BP 142/86 | HR 86

## 2022-04-10 DIAGNOSIS — E669 Obesity, unspecified: Secondary | ICD-10-CM | POA: Diagnosis present

## 2022-04-10 DIAGNOSIS — O09523 Supervision of elderly multigravida, third trimester: Secondary | ICD-10-CM

## 2022-04-10 DIAGNOSIS — O99213 Obesity complicating pregnancy, third trimester: Secondary | ICD-10-CM | POA: Insufficient documentation

## 2022-04-10 DIAGNOSIS — Z3A36 36 weeks gestation of pregnancy: Secondary | ICD-10-CM

## 2022-04-10 NOTE — Procedures (Signed)
Rebekah [name] 1985/04/27 [redacted]w[redacted]d  Fetus A Non-Stress Test Interpretation for 04/10/22  Indication: AMA, obesity, IVF  Fetal Heart Rate A Mode: External Baseline Rate (A): 135 bpm Variability: Moderate Accelerations: 15 x 15 Decelerations: None Multiple birth?: No  Uterine Activity Mode: Palpation, Toco Contraction Frequency (min): none  Interpretation (Fetal Testing) Nonstress Test Interpretation: Reactive Comments: Dr. Gertie Exon reviewed tracing.

## 2022-04-16 ENCOUNTER — Inpatient Hospital Stay (HOSPITAL_COMMUNITY)
Admission: AD | Admit: 2022-04-16 | Discharge: 2022-04-19 | DRG: 788 | Disposition: A | Discharge status: Home / Self Care | Payer: PRIVATE HEALTH INSURANCE | Attending: Obstetrics and Gynecology | Admitting: Obstetrics and Gynecology

## 2022-04-16 ENCOUNTER — Encounter (HOSPITAL_COMMUNITY): Payer: Self-pay | Admitting: Obstetrics and Gynecology

## 2022-04-16 DIAGNOSIS — R030 Elevated blood-pressure reading, without diagnosis of hypertension: Secondary | ICD-10-CM | POA: Diagnosis present

## 2022-04-16 DIAGNOSIS — I4581 Long QT syndrome: Secondary | ICD-10-CM | POA: Diagnosis present

## 2022-04-16 DIAGNOSIS — Z98891 History of uterine scar from previous surgery: Principal | ICD-10-CM

## 2022-04-16 DIAGNOSIS — O9942 Diseases of the circulatory system complicating childbirth: Secondary | ICD-10-CM | POA: Diagnosis present

## 2022-04-16 DIAGNOSIS — O134 Gestational [pregnancy-induced] hypertension without significant proteinuria, complicating childbirth: Secondary | ICD-10-CM | POA: Diagnosis not present

## 2022-04-16 DIAGNOSIS — O139 Gestational [pregnancy-induced] hypertension without significant proteinuria, unspecified trimester: Secondary | ICD-10-CM | POA: Diagnosis present

## 2022-04-16 DIAGNOSIS — O133 Gestational [pregnancy-induced] hypertension without significant proteinuria, third trimester: Secondary | ICD-10-CM | POA: Diagnosis not present

## 2022-04-16 DIAGNOSIS — Z79899 Other long term (current) drug therapy: Secondary | ICD-10-CM | POA: Diagnosis not present

## 2022-04-16 DIAGNOSIS — Z3A37 37 weeks gestation of pregnancy: Secondary | ICD-10-CM

## 2022-04-16 DIAGNOSIS — Z7982 Long term (current) use of aspirin: Secondary | ICD-10-CM | POA: Diagnosis not present

## 2022-04-16 LAB — URINALYSIS, ROUTINE W REFLEX MICROSCOPIC
Bilirubin Urine: NEGATIVE
Glucose, UA: NEGATIVE mg/dL
Hgb urine dipstick: NEGATIVE
Ketones, ur: NEGATIVE mg/dL
Leukocytes,Ua: NEGATIVE
Nitrite: NEGATIVE
Protein, ur: NEGATIVE mg/dL
Specific Gravity, Urine: 1.013 (ref 1.005–1.030)
pH: 6 (ref 5.0–8.0)

## 2022-04-16 LAB — COMPREHENSIVE METABOLIC PANEL
ALT: 13 U/L (ref 0–44)
AST: 23 U/L (ref 15–41)
Albumin: 2.8 g/dL — ABNORMAL LOW (ref 3.5–5.0)
Alkaline Phosphatase: 95 U/L (ref 38–126)
Anion gap: 11 (ref 5–15)
BUN: 7 mg/dL (ref 6–20)
CO2: 18 mmol/L — ABNORMAL LOW (ref 22–32)
Calcium: 8.4 mg/dL — ABNORMAL LOW (ref 8.9–10.3)
Chloride: 106 mmol/L (ref 98–111)
Creatinine, Ser: 0.59 mg/dL (ref 0.44–1.00)
GFR, Estimated: >60 mL/min (ref >60)
Glucose, Bld: 140 mg/dL — ABNORMAL HIGH (ref 70–99)
Potassium: 3.7 mmol/L (ref 3.5–5.1)
Sodium: 135 mmol/L (ref 135–145)
Total Bilirubin: 0.3 mg/dL (ref 0.3–1.2)
Total Protein: 5.7 g/dL — ABNORMAL LOW (ref 6.5–8.1)

## 2022-04-16 LAB — TYPE AND SCREEN
ABO/RH(D): O NEG
Antibody Screen: POSITIVE

## 2022-04-16 LAB — CBC
HCT: 34.3 % — ABNORMAL LOW (ref 36.0–46.0)
Hemoglobin: 12.4 g/dL (ref 12.0–15.0)
MCH: 31.8 pg (ref 26.0–34.0)
MCHC: 36.2 g/dL — ABNORMAL HIGH (ref 30.0–36.0)
MCV: 87.9 fL (ref 80.0–100.0)
Platelets: 239 10*3/uL (ref 150–400)
RBC: 3.9 MIL/uL (ref 3.87–5.11)
RDW: 13.8 % (ref 11.5–15.5)
WBC: 12.6 10*3/uL — ABNORMAL HIGH (ref 4.0–10.5)
nRBC: 0 % (ref 0.0–0.2)

## 2022-04-16 LAB — PROTEIN / CREATININE RATIO, URINE
Creatinine, Urine: 87 mg/dL
Protein Creatinine Ratio: 0.13 mg/mg{Cre} (ref 0.00–0.15)
Total Protein, Urine: 11 mg/dL

## 2022-04-16 LAB — HIV ANTIBODY (ROUTINE TESTING W REFLEX): HIV Screen 4th Generation wRfx: NONREACTIVE

## 2022-04-16 MED ORDER — LIDOCAINE HCL (PF) 1 % IJ SOLN: 30.0000 mL | INTRAMUSCULAR | Status: DC | PRN

## 2022-04-16 MED ORDER — LABETALOL HCL 5 MG/ML IV SOLN: 80.0000 mg | INTRAVENOUS | Status: DC | PRN

## 2022-04-16 MED ORDER — LABETALOL HCL 5 MG/ML IV SOLN: 40.0000 mg | INTRAVENOUS | Status: DC | PRN

## 2022-04-16 MED ORDER — SOD CITRATE-CITRIC ACID 500-334 MG/5ML PO SOLN
30.0000 mL | ORAL | Status: DC | PRN
  Filled 2022-04-16: qty 30

## 2022-04-16 MED ORDER — HYDRALAZINE HCL 20 MG/ML IJ SOLN: 10.0000 mg | INTRAMUSCULAR | Status: DC | PRN

## 2022-04-16 MED ORDER — MISOPROSTOL 25 MCG QUARTER TABLET
25.0000 ug | ORAL_TABLET | Freq: Once | ORAL | Status: AC
  Administered 2022-04-16: 25 ug via VAGINAL
  Filled 2022-04-16: qty 1

## 2022-04-16 MED ORDER — LABETALOL HCL 5 MG/ML IV SOLN: 20.0000 mg | INTRAVENOUS | Status: DC | PRN

## 2022-04-16 MED ORDER — LACTATED RINGERS IV SOLN: 500.0000 mL | INTRAVENOUS | Status: DC | PRN

## 2022-04-16 MED ORDER — TERBUTALINE SULFATE 1 MG/ML IJ SOLN: 0.2500 mg | Freq: Once | INTRAMUSCULAR | Status: DC | PRN

## 2022-04-16 MED ORDER — METOPROLOL TARTRATE 50 MG PO TABS
50.0000 mg | ORAL_TABLET | Freq: Two times a day (BID) | ORAL | Status: DC
  Administered 2022-04-16 – 2022-04-17 (×2): 50 mg via ORAL
  Filled 2022-04-16 (×4): qty 1

## 2022-04-16 MED ORDER — MISOPROSTOL 50MCG HALF TABLET
50.0000 ug | ORAL_TABLET | ORAL | Status: DC | PRN
  Administered 2022-04-17 (×3): 50 ug via ORAL
  Filled 2022-04-16 (×4): qty 1

## 2022-04-16 MED ORDER — OXYCODONE-ACETAMINOPHEN 5-325 MG PO TABS: 2.0000 | ORAL_TABLET | ORAL | Status: DC | PRN

## 2022-04-16 MED ORDER — FENTANYL CITRATE (PF) 100 MCG/2ML IJ SOLN: 50.0000 ug | INTRAMUSCULAR | Status: DC | PRN

## 2022-04-16 MED ORDER — MISOPROSTOL 25 MCG QUARTER TABLET
25.0000 ug | ORAL_TABLET | ORAL | Status: DC
  Administered 2022-04-16 (×2): 25 ug via ORAL
  Filled 2022-04-16 (×2): qty 1

## 2022-04-16 MED ORDER — LACTATED RINGERS IV SOLN: INTRAVENOUS | Status: DC

## 2022-04-16 MED ORDER — OXYCODONE-ACETAMINOPHEN 5-325 MG PO TABS: 1.0000 | ORAL_TABLET | ORAL | Status: DC | PRN

## 2022-04-16 MED ORDER — OXYTOCIN BOLUS FROM INFUSION: 333.0000 mL | Freq: Once | INTRAVENOUS | Status: DC

## 2022-04-16 MED ORDER — ACETAMINOPHEN 325 MG PO TABS: 650.0000 mg | ORAL_TABLET | ORAL | Status: DC | PRN

## 2022-04-16 MED ORDER — ONDANSETRON HCL 4 MG/2ML IJ SOLN: 4.0000 mg | Freq: Four times a day (QID) | INTRAMUSCULAR | Status: DC | PRN

## 2022-04-16 MED ORDER — OXYTOCIN-SODIUM CHLORIDE 30-0.9 UT/500ML-% IV SOLN: 2.5000 [IU]/h | INTRAVENOUS | Status: DC

## 2022-04-16 NOTE — H&P (Addendum)
History    CSN: [id number]   Arrival date and time: 04/16/22 1320    Event Date/Time   First Provider Initiated Contact with Patient 04/16/22 1420          Chief Complaint  Patient presents with   Hypertension    Rebekah [name] is a 37 y.o. G1P0 at [redacted]w[redacted]d who presents today from the office for evaluation of hypertension. She states that at her appt on 04/13/2022 blood pressure  was elevated. She had pre-eclampsia labs done at that time and all were normal. She was back at the office today 04/16/2022 for follow up BP check and blood pressure was again elevated. She was sent here for further evaluation. She denies any contractions, VB or  LOF. She reports normal fetal movement. She denies any HA, visual disturbance or RUQ pain. Of note, patient has prolonged QT syndrome. She is also on Metoprolol 50mg  BID for heart rate control.    Hypertension       OB History       Gravida  1   Para      Term      Preterm      AB      Living           SAB      IAB      Ectopic      Multiple      Live Births                      Past Medical History:  Diagnosis Date   Anxiety     Atrial tachycardia     Headache     IBS (irritable bowel syndrome)     Long Q-T syndrome     PCOS (polycystic ovarian syndrome)             Past Surgical History:  Procedure Laterality Date   ivf egg retrieval       LOOP RECORDER INSERTION               Family History  Problem Relation Age of Onset   Cancer Mother     Arthritis Mother     Cancer Maternal Aunt     Heart disease Maternal Aunt     Hypertension Maternal Aunt     Stroke Maternal Grandmother     Diabetes Neg Hx        Social History        Tobacco Use   Smoking status: Never   Smokeless tobacco: Never  Vaping Use   Vaping Use: Never used  Substance Use Topics   Alcohol use: Not Currently   Drug use: Never      Allergies: No Known Allergies          Medications Prior to Admission  Medication Sig Dispense  Refill Last Dose   aspirin EC 81 MG tablet Take 81 mg by mouth daily. Swallow whole.     04/15/2022   famotidine (PEPCID) 40 MG tablet Take 40 mg by mouth daily.     04/15/2022   metoprolol tartrate (LOPRESSOR) 50 MG tablet Take 50 mg by mouth 2 (two) times daily.     04/16/2022   Prenatal Vit-Fe Fumarate-FA (PRENATAL MULTIVITAMIN) TABS tablet Take 1 tablet by mouth daily at 12 noon.     04/15/2022   VITAMIN D PO Take 5,000 Units by mouth daily.     04/16/2022      Review of Systems  All  other systems reviewed and are negative.   Physical Exam    Blood pressure (!) 152/84, pulse (!) 108, temperature 98.3 F (36.8 C), temperature source Oral, resp. rate 19, SpO2 99 %.   Physical Exam Constitutional:      Appearance: She is well-developed.  HENT:     Head: Normocephalic.  Eyes:     Pupils: Pupils are equal, round, and reactive to light.  Cardiovascular:     Rate and Rhythm: Normal rate.  Pulmonary:     Effort: Pulmonary effort is normal. No respiratory distress.  Abdominal:     Palpations: Abdomen is soft.     Tenderness: There is no abdominal tenderness.  Genitourinary:    Vagina: No bleeding. Vaginal discharge: mucusy.    Comments: External: no lesion Vagina: small amount of white discharge       Musculoskeletal:        General: Normal range of motion.     Cervical back: Normal range of motion and neck supple.     Right lower leg: Edema present.     Left lower leg: Edema present.  Skin:    General: Skin is warm and dry.  Neurological:     Mental Status: She is alert and oriented to person, place, and time.     Deep Tendon Reflexes: Reflexes normal.     Comments: No clonus   Psychiatric:        Mood and Affect: Mood normal.        Behavior: Behavior normal.          NST:  Baseline: 125 Variability: moderate Accels: 15x15 Decels: none Toco: none Reactive/Appropriate for GA     Patient Vitals for the past 24 hrs:   BP Temp Temp src Pulse Resp SpO2  04/16/22 1531  (!) 152/84 -- -- (!) 108 -- --  04/16/22 1516 (!) 144/91 -- -- (!) 107 -- --  04/16/22 1504 (!) 172/86 -- -- (!) 104 -- --  04/16/22 1431 (!) 142/86 -- -- (!) 114 -- 99 %  04/16/22 1416 (!) 139/90 -- -- (!) 112 -- 99 %  04/16/22 1401 (!) 160/89 -- -- (!) 116 -- 99 %  04/16/22 1346 (!) 150/87 -- -- (!) 115 -- 100 %  04/16/22 1335 (!) 150/89 98.3 F (36.8 C) Oral (!) 116 19 100 %        Lab Results Last 24 Hours       Results for orders placed or performed during the hospital encounter of 04/16/22 (from the past 24 hour(s))  Urinalysis, Routine w reflex microscopic Urine, Clean Catch     Status: None    Collection Time: 04/16/22  1:42 PM  Result Value Ref Range    Color, Urine YELLOW YELLOW    APPearance CLEAR CLEAR    Specific Gravity, Urine 1.013 1.005 - 1.030    pH 6.0 5.0 - 8.0    Glucose, UA NEGATIVE NEGATIVE mg/dL    Hgb urine dipstick NEGATIVE NEGATIVE    Bilirubin Urine NEGATIVE NEGATIVE    Ketones, ur NEGATIVE NEGATIVE mg/dL    Protein, ur NEGATIVE NEGATIVE mg/dL    Nitrite NEGATIVE NEGATIVE    Leukocytes,Ua NEGATIVE NEGATIVE  Protein / creatinine ratio, urine     Status: None    Collection Time: 04/16/22  1:58 PM  Result Value Ref Range    Creatinine, Urine 87 mg/dL    Total Protein, Urine 11 mg/dL    Protein Creatinine Ratio 0.13 0.00 - 0.15 mg/mg[Cre]  CBC     Status: Abnormal    Collection Time: 04/16/22  2:07 PM  Result Value Ref Range    WBC 12.6 (H) 4.0 - 10.5 K/uL    RBC 3.90 3.87 - 5.11 MIL/uL    Hemoglobin 12.4 12.0 - 15.0 g/dL    HCT 08.1 (L) 44.8 - 46.0 %    MCV 87.9 80.0 - 100.0 fL    MCH 31.8 26.0 - 34.0 pg    MCHC 36.2 (H) 30.0 - 36.0 g/dL    RDW 18.5 63.1 - 49.7 %    Platelets 239 150 - 400 K/uL    nRBC 0.0 0.0 - 0.2 %  Comprehensive metabolic panel     Status: Abnormal    Collection Time: 04/16/22  2:07 PM  Result Value Ref Range    Sodium 135 135 - 145 mmol/L    Potassium 3.7 3.5 - 5.1 mmol/L    Chloride 106 98 - 111 mmol/L    CO2 18 (L)  22 - 32 mmol/L    Glucose, Bld 140 (H) 70 - 99 mg/dL    BUN 7 6 - 20 mg/dL    Creatinine, Ser 0.26 0.44 - 1.00 mg/dL    Calcium 8.4 (L) 8.9 - 10.3 mg/dL    Total Protein 5.7 (L) 6.5 - 8.1 g/dL    Albumin 2.8 (L) 3.5 - 5.0 g/dL    AST 23 15 - 41 U/L    ALT 13 0 - 44 U/L    Alkaline Phosphatase 95 38 - 126 U/L    Total Bilirubin 0.3 0.3 - 1.2 mg/dL    GFR, Estimated >37 >85 mL/min    Anion gap 11 5 - 15        MAU Course  Procedures   MDM 1540: DW Dr. Rana Snare, will admit to labor and delivery for IOL 2/2 GHTN.    Assessment and Plan    1. Gestational hypertension, third trimester   2. [redacted] weeks gestation of pregnancy     Admit to labor and delivery Dr. Rana Snare assumes care of the patient.    Thressa Sheller DNP, CNM  04/16/22  3:44 PM   I reviewed the records and I spoke with Britini re: diagnosis and treatment with delivery and discussed the 2 stage process At this time does not have severe Pre eclampsia sxs and normal labs.  Will defer magnesium for now DL

## 2022-04-16 NOTE — MAU Provider Note (Signed)
History     CSN: [id number]  Arrival date and time: 04/16/22 1320   Event Date/Time   First Provider Initiated Contact with Patient 04/16/22 1420      Chief Complaint  Patient presents with   Hypertension   Rebekah [name] is a 37 y.o. G1P0 at [redacted]w[redacted]d who presents today from the office for evaluation of hypertension. She states that at her appt on 04/13/2022 blood pressure  was elevated. She had pre-eclampsia labs done at that time and all were normal. She was back at the office today 04/16/2022 for follow up BP check and blood pressure was again elevated. She was sent here for further evaluation. She denies any contractions, VB or  LOF. She reports normal fetal movement. She denies any HA, visual disturbance or RUQ pain. Of note, patient has prolonged QT syndrome. She is also on Metoprolol 50mg  BID for heart rate control.   Hypertension    OB History     Gravida  1   Para      Term      Preterm      AB      Living         SAB      IAB      Ectopic      Multiple      Live Births              Past Medical History:  Diagnosis Date   Anxiety    Atrial tachycardia    Headache    IBS (irritable bowel syndrome)    Long Q-T syndrome    PCOS (polycystic ovarian syndrome)     Past Surgical History:  Procedure Laterality Date   ivf egg retrieval     LOOP RECORDER INSERTION      Family History  Problem Relation Age of Onset   Cancer Mother    Arthritis Mother    Cancer Maternal Aunt    Heart disease Maternal Aunt    Hypertension Maternal Aunt    Stroke Maternal Grandmother    Diabetes Neg Hx     Social History   Tobacco Use   Smoking status: Never   Smokeless tobacco: Never  Vaping Use   Vaping Use: Never used  Substance Use Topics   Alcohol use: Not Currently   Drug use: Never    Allergies: No Known Allergies  Medications Prior to Admission  Medication Sig Dispense Refill Last Dose   aspirin EC 81 MG tablet Take 81 mg by mouth daily.  Swallow whole.   04/15/2022   famotidine (PEPCID) 40 MG tablet Take 40 mg by mouth daily.   04/15/2022   metoprolol tartrate (LOPRESSOR) 50 MG tablet Take 50 mg by mouth 2 (two) times daily.   04/16/2022   Prenatal Vit-Fe Fumarate-FA (PRENATAL MULTIVITAMIN) TABS tablet Take 1 tablet by mouth daily at 12 noon.   04/15/2022   VITAMIN D PO Take 5,000 Units by mouth daily.   04/16/2022    Review of Systems  All other systems reviewed and are negative.  Physical Exam   Blood pressure (!) 152/84, pulse (!) 108, temperature 98.3 F (36.8 C), temperature source Oral, resp. rate 19, SpO2 99 %.  Physical Exam Constitutional:      Appearance: She is well-developed.  HENT:     Head: Normocephalic.  Eyes:     Pupils: Pupils are equal, round, and reactive to light.  Cardiovascular:     Rate and Rhythm: Normal rate.  Pulmonary:  Effort: Pulmonary effort is normal. No respiratory distress.  Abdominal:     Palpations: Abdomen is soft.     Tenderness: There is no abdominal tenderness.  Genitourinary:    Vagina: No bleeding. Vaginal discharge: mucusy.    Comments: External: no lesion Vagina: small amount of white discharge     Musculoskeletal:        General: Normal range of motion.     Cervical back: Normal range of motion and neck supple.     Right lower leg: Edema present.     Left lower leg: Edema present.  Skin:    General: Skin is warm and dry.  Neurological:     Mental Status: She is alert and oriented to person, place, and time.     Deep Tendon Reflexes: Reflexes normal.     Comments: No clonus   Psychiatric:        Mood and Affect: Mood normal.        Behavior: Behavior normal.      NST:  Baseline: 125 Variability: moderate Accels: 15x15 Decels: none Toco: none Reactive/Appropriate for GA   Patient Vitals for the past 24 hrs:  BP Temp Temp src Pulse Resp SpO2  04/16/22 1531 (!) 152/84 -- -- (!) 108 -- --  04/16/22 1516 (!) 144/91 -- -- (!) 107 -- --  04/16/22 1504  (!) 172/86 -- -- (!) 104 -- --  04/16/22 1431 (!) 142/86 -- -- (!) 114 -- 99 %  04/16/22 1416 (!) 139/90 -- -- (!) 112 -- 99 %  04/16/22 1401 (!) 160/89 -- -- (!) 116 -- 99 %  04/16/22 1346 (!) 150/87 -- -- (!) 115 -- 100 %  04/16/22 1335 (!) 150/89 98.3 F (36.8 C) Oral (!) 116 19 100 %     Results for orders placed or performed during the hospital encounter of 04/16/22 (from the past 24 hour(s))  Urinalysis, Routine w reflex microscopic Urine, Clean Catch     Status: None   Collection Time: 04/16/22  1:42 PM  Result Value Ref Range   Color, Urine YELLOW YELLOW   APPearance CLEAR CLEAR   Specific Gravity, Urine 1.013 1.005 - 1.030   pH 6.0 5.0 - 8.0   Glucose, UA NEGATIVE NEGATIVE mg/dL   Hgb urine dipstick NEGATIVE NEGATIVE   Bilirubin Urine NEGATIVE NEGATIVE   Ketones, ur NEGATIVE NEGATIVE mg/dL   Protein, ur NEGATIVE NEGATIVE mg/dL   Nitrite NEGATIVE NEGATIVE   Leukocytes,Ua NEGATIVE NEGATIVE  Protein / creatinine ratio, urine     Status: None   Collection Time: 04/16/22  1:58 PM  Result Value Ref Range   Creatinine, Urine 87 mg/dL   Total Protein, Urine 11 mg/dL   Protein Creatinine Ratio 0.13 0.00 - 0.15 mg/mg[Cre]  CBC     Status: Abnormal   Collection Time: 04/16/22  2:07 PM  Result Value Ref Range   WBC 12.6 (H) 4.0 - 10.5 K/uL   RBC 3.90 3.87 - 5.11 MIL/uL   Hemoglobin 12.4 12.0 - 15.0 g/dL   HCT 34.7 (L) 42.5 - 95.6 %   MCV 87.9 80.0 - 100.0 fL   MCH 31.8 26.0 - 34.0 pg   MCHC 36.2 (H) 30.0 - 36.0 g/dL   RDW 38.7 56.4 - 33.2 %   Platelets 239 150 - 400 K/uL   nRBC 0.0 0.0 - 0.2 %  Comprehensive metabolic panel     Status: Abnormal   Collection Time: 04/16/22  2:07 PM  Result Value Ref Range  Sodium 135 135 - 145 mmol/L   Potassium 3.7 3.5 - 5.1 mmol/L   Chloride 106 98 - 111 mmol/L   CO2 18 (L) 22 - 32 mmol/L   Glucose, Bld 140 (H) 70 - 99 mg/dL   BUN 7 6 - 20 mg/dL   Creatinine, Ser 0.59 0.44 - 1.00 mg/dL   Calcium 8.4 (L) 8.9 - 10.3 mg/dL   Total  Protein 5.7 (L) 6.5 - 8.1 g/dL   Albumin 2.8 (L) 3.5 - 5.0 g/dL   AST 23 15 - 41 U/L   ALT 13 0 - 44 U/L   Alkaline Phosphatase 95 38 - 126 U/L   Total Bilirubin 0.3 0.3 - 1.2 mg/dL   GFR, Estimated >60 >60 mL/min   Anion gap 11 5 - 15    MAU Course  Procedures  MDM 1540: DW Dr. Corinna Capra, will admit to labor and delivery for IOL 2/2 GHTN.   Assessment and Plan   1. Gestational hypertension, third trimester   2. [redacted] weeks gestation of pregnancy    Admit to labor and delivery Dr. Corinna Capra assumes care of the patient.   Marcille Buffy DNP, CNM  04/16/22  3:44 PM

## 2022-04-16 NOTE — MAU Note (Signed)
...  Rebekah [name] is a 37 y.o. at [redacted]w[redacted]d here in MAU reporting: Sent here from the office for blood pressure evaluation. She reports she had an elevated blood pressure in the office this past Friday so they collected Pre-E labs and had her monitor her pressures over the weekend. She reports she went back to the office today for a blood pressure check and it was 140/98 so they sent here her. She also reports there was a small amount of protein in her urine dipstick today. Denies visual disturbances, HA, and epigastric/RUQ pain. Endorses swelling in her bilateral lower extremities. Denies VB or LOF. +FM.   She reports she was closed and thick in the office today.  Pain score: Denies pain.  FHT: 154 initial external Lab orders placed from triage:  UA

## 2022-04-17 ENCOUNTER — Ambulatory Visit: Payer: PRIVATE HEALTH INSURANCE

## 2022-04-17 ENCOUNTER — Encounter (HOSPITAL_COMMUNITY)
Admission: AD | Disposition: A | Discharge status: Home / Self Care | Payer: Self-pay | Source: Home / Self Care | Attending: Obstetrics and Gynecology

## 2022-04-17 ENCOUNTER — Inpatient Hospital Stay (HOSPITAL_COMMUNITY): Payer: PRIVATE HEALTH INSURANCE | Admitting: Anesthesiology

## 2022-04-17 ENCOUNTER — Encounter (HOSPITAL_COMMUNITY): Payer: Self-pay | Admitting: Obstetrics and Gynecology

## 2022-04-17 ENCOUNTER — Other Ambulatory Visit: Payer: Self-pay

## 2022-04-17 DIAGNOSIS — O134 Gestational [pregnancy-induced] hypertension without significant proteinuria, complicating childbirth: Secondary | ICD-10-CM

## 2022-04-17 DIAGNOSIS — Z3A37 37 weeks gestation of pregnancy: Secondary | ICD-10-CM

## 2022-04-17 LAB — COMPREHENSIVE METABOLIC PANEL
ALT: 14 U/L (ref 0–44)
AST: 19 U/L (ref 15–41)
Albumin: 2.8 g/dL — ABNORMAL LOW (ref 3.5–5.0)
Alkaline Phosphatase: 93 U/L (ref 38–126)
Anion gap: 9 (ref 5–15)
BUN: <5 mg/dL — ABNORMAL LOW (ref 6–20)
CO2: 21 mmol/L — ABNORMAL LOW (ref 22–32)
Calcium: 8.6 mg/dL — ABNORMAL LOW (ref 8.9–10.3)
Chloride: 105 mmol/L (ref 98–111)
Creatinine, Ser: 0.57 mg/dL (ref 0.44–1.00)
GFR, Estimated: >60 mL/min (ref >60)
Glucose, Bld: 82 mg/dL (ref 70–99)
Potassium: 3.4 mmol/L — ABNORMAL LOW (ref 3.5–5.1)
Sodium: 135 mmol/L (ref 135–145)
Total Bilirubin: 0.4 mg/dL (ref 0.3–1.2)
Total Protein: 5.7 g/dL — ABNORMAL LOW (ref 6.5–8.1)

## 2022-04-17 LAB — CBC
HCT: 34.8 % — ABNORMAL LOW (ref 36.0–46.0)
HCT: 35.1 % — ABNORMAL LOW (ref 36.0–46.0)
Hemoglobin: 12 g/dL (ref 12.0–15.0)
Hemoglobin: 12.5 g/dL (ref 12.0–15.0)
MCH: 31.2 pg (ref 26.0–34.0)
MCH: 32 pg (ref 26.0–34.0)
MCHC: 34.5 g/dL (ref 30.0–36.0)
MCHC: 35.6 g/dL (ref 30.0–36.0)
MCV: 90.4 fL (ref 80.0–100.0)
MCV: 89.8 fL (ref 80.0–100.0)
Platelets: 213 10*3/uL (ref 150–400)
Platelets: 218 10*3/uL (ref 150–400)
RBC: 3.85 MIL/uL — ABNORMAL LOW (ref 3.87–5.11)
RBC: 3.91 MIL/uL (ref 3.87–5.11)
RDW: 14 % (ref 11.5–15.5)
RDW: 13.8 % (ref 11.5–15.5)
WBC: 11.2 10*3/uL — ABNORMAL HIGH (ref 4.0–10.5)
WBC: 11.7 10*3/uL — ABNORMAL HIGH (ref 4.0–10.5)
nRBC: 0 % (ref 0.0–0.2)
nRBC: 0 % (ref 0.0–0.2)

## 2022-04-17 LAB — RPR: RPR Ser Ql: NONREACTIVE

## 2022-04-17 SURGERY — Surgical Case
Anesthesia: Spinal

## 2022-04-17 MED ORDER — OXYCODONE HCL 5 MG PO TABS: 5.0000 mg | ORAL_TABLET | Freq: Once | ORAL | Status: DC | PRN

## 2022-04-17 MED ORDER — WITCH HAZEL-GLYCERIN EX PADS: 1.0000 | MEDICATED_PAD | CUTANEOUS | Status: DC | PRN

## 2022-04-17 MED ORDER — ACETAMINOPHEN 500 MG PO TABS
1000.0000 mg | ORAL_TABLET | Freq: Four times a day (QID) | ORAL | Status: DC
  Administered 2022-04-18 – 2022-04-19 (×6): 1000 mg via ORAL
  Filled 2022-04-17 (×7): qty 2

## 2022-04-17 MED ORDER — SENNOSIDES-DOCUSATE SODIUM 8.6-50 MG PO TABS
2.0000 | ORAL_TABLET | Freq: Every day | ORAL | Status: DC
  Administered 2022-04-18 – 2022-04-19 (×2): 2 via ORAL
  Filled 2022-04-17 (×2): qty 2

## 2022-04-17 MED ORDER — COCONUT OIL OIL: 1.0000 | TOPICAL_OIL | Status: DC | PRN

## 2022-04-17 MED ORDER — FENTANYL CITRATE (PF) 100 MCG/2ML IJ SOLN
INTRAMUSCULAR | Status: AC
  Filled 2022-04-17: qty 2

## 2022-04-17 MED ORDER — PHENYLEPHRINE HCL-NACL 20-0.9 MG/250ML-% IV SOLN
INTRAVENOUS | Status: DC | PRN
  Administered 2022-04-17: 60 ug/min via INTRAVENOUS

## 2022-04-17 MED ORDER — DEXAMETHASONE SODIUM PHOSPHATE 4 MG/ML IJ SOLN
INTRAMUSCULAR | Status: AC
  Filled 2022-04-17: qty 2

## 2022-04-17 MED ORDER — HYDROMORPHONE HCL 1 MG/ML IJ SOLN: 0.2500 mg | INTRAMUSCULAR | Status: DC | PRN

## 2022-04-17 MED ORDER — SODIUM CHLORIDE 0.9 % IR SOLN
Status: DC | PRN
  Administered 2022-04-17: 1000 mL

## 2022-04-17 MED ORDER — IBUPROFEN 600 MG PO TABS
600.0000 mg | ORAL_TABLET | Freq: Four times a day (QID) | ORAL | Status: DC | PRN
  Administered 2022-04-18 (×3): 600 mg via ORAL
  Filled 2022-04-17 (×3): qty 1

## 2022-04-17 MED ORDER — LACTATED RINGERS IV SOLN: INTRAVENOUS | Status: DC

## 2022-04-17 MED ORDER — OXYCODONE HCL 5 MG/5ML PO SOLN: 5.0000 mg | Freq: Once | ORAL | Status: DC | PRN

## 2022-04-17 MED ORDER — NALOXONE HCL 0.4 MG/ML IJ SOLN: 0.4000 mg | INTRAMUSCULAR | Status: DC | PRN

## 2022-04-17 MED ORDER — DIPHENHYDRAMINE HCL 50 MG/ML IJ SOLN: 12.5000 mg | INTRAMUSCULAR | Status: DC | PRN

## 2022-04-17 MED ORDER — KETOROLAC TROMETHAMINE 30 MG/ML IJ SOLN: 30.0000 mg | Freq: Once | INTRAMUSCULAR | Status: DC | PRN

## 2022-04-17 MED ORDER — OXYTOCIN-SODIUM CHLORIDE 30-0.9 UT/500ML-% IV SOLN
INTRAVENOUS | Status: AC
  Filled 2022-04-17: qty 500

## 2022-04-17 MED ORDER — STERILE WATER FOR IRRIGATION IR SOLN
Status: DC | PRN
  Administered 2022-04-17: 1000 mL

## 2022-04-17 MED ORDER — NALOXONE HCL 4 MG/10ML IJ SOLN: 1.0000 ug/kg/h | INTRAVENOUS | Status: DC | PRN

## 2022-04-17 MED ORDER — SIMETHICONE 80 MG PO CHEW
80.0000 mg | CHEWABLE_TABLET | Freq: Three times a day (TID) | ORAL | Status: DC
  Administered 2022-04-18 – 2022-04-19 (×5): 80 mg via ORAL
  Filled 2022-04-17 (×5): qty 1

## 2022-04-17 MED ORDER — OXYTOCIN-SODIUM CHLORIDE 30-0.9 UT/500ML-% IV SOLN
1.0000 m[IU]/min | INTRAVENOUS | Status: DC
  Administered 2022-04-17: 2 m[IU]/min via INTRAVENOUS
  Filled 2022-04-17: qty 500

## 2022-04-17 MED ORDER — FENTANYL CITRATE (PF) 100 MCG/2ML IJ SOLN
INTRAMUSCULAR | Status: DC | PRN
  Administered 2022-04-17: 15 ug via INTRATHECAL

## 2022-04-17 MED ORDER — TERBUTALINE SULFATE 1 MG/ML IJ SOLN: 0.2500 mg | Freq: Once | INTRAMUSCULAR | Status: DC | PRN

## 2022-04-17 MED ORDER — OXYCODONE HCL 5 MG PO TABS: 5.0000 mg | ORAL_TABLET | ORAL | Status: DC | PRN

## 2022-04-17 MED ORDER — PHENYLEPHRINE HCL-NACL 20-0.9 MG/250ML-% IV SOLN
INTRAVENOUS | Status: AC
  Filled 2022-04-17: qty 250

## 2022-04-17 MED ORDER — DEXTROSE 5 % IV SOLN
INTRAVENOUS | Status: DC | PRN
  Administered 2022-04-17: 3 g via INTRAVENOUS

## 2022-04-17 MED ORDER — ONDANSETRON HCL 4 MG/2ML IJ SOLN: 4.0000 mg | Freq: Once | INTRAMUSCULAR | Status: DC | PRN

## 2022-04-17 MED ORDER — DIPHENHYDRAMINE HCL 25 MG PO CAPS: 25.0000 mg | ORAL_CAPSULE | ORAL | Status: DC | PRN

## 2022-04-17 MED ORDER — DIBUCAINE (PERIANAL) 1 % EX OINT: 1.0000 | TOPICAL_OINTMENT | CUTANEOUS | Status: DC | PRN

## 2022-04-17 MED ORDER — OXYTOCIN-SODIUM CHLORIDE 30-0.9 UT/500ML-% IV SOLN
INTRAVENOUS | Status: DC | PRN
  Administered 2022-04-17: 200 mL via INTRAVENOUS

## 2022-04-17 MED ORDER — ZOLPIDEM TARTRATE 5 MG PO TABS: 5.0000 mg | ORAL_TABLET | Freq: Every evening | ORAL | Status: DC | PRN

## 2022-04-17 MED ORDER — MORPHINE SULFATE (PF) 0.5 MG/ML IJ SOLN
INTRAMUSCULAR | Status: AC
  Filled 2022-04-17: qty 10

## 2022-04-17 MED ORDER — SIMETHICONE 80 MG PO CHEW: 80.0000 mg | CHEWABLE_TABLET | ORAL | Status: DC | PRN

## 2022-04-17 MED ORDER — SODIUM CHLORIDE 0.9% FLUSH: 3.0000 mL | INTRAVENOUS | Status: DC | PRN

## 2022-04-17 MED ORDER — BUPIVACAINE IN DEXTROSE 0.75-8.25 % IT SOLN
INTRATHECAL | Status: DC | PRN
  Administered 2022-04-17: 1.6 mL via INTRATHECAL

## 2022-04-17 MED ORDER — ONDANSETRON HCL 4 MG/2ML IJ SOLN
INTRAMUSCULAR | Status: DC | PRN
  Administered 2022-04-17: 4 mg via INTRAVENOUS

## 2022-04-17 MED ORDER — CEFAZOLIN IN SODIUM CHLORIDE 3-0.9 GM/100ML-% IV SOLN
INTRAVENOUS | Status: AC
  Filled 2022-04-17: qty 100

## 2022-04-17 MED ORDER — PHENYLEPHRINE HCL (PRESSORS) 10 MG/ML IV SOLN
INTRAVENOUS | Status: DC | PRN
  Administered 2022-04-17 (×3): 80 ug via INTRAVENOUS

## 2022-04-17 MED ORDER — MORPHINE SULFATE (PF) 0.5 MG/ML IJ SOLN
INTRAMUSCULAR | Status: DC | PRN
  Administered 2022-04-17: 150 ug via INTRATHECAL

## 2022-04-17 MED ORDER — SCOPOLAMINE 1 MG/3DAYS TD PT72
1.0000 | MEDICATED_PATCH | TRANSDERMAL | Status: DC
  Administered 2022-04-17: 1.5 mg via TRANSDERMAL
  Filled 2022-04-17: qty 1

## 2022-04-17 MED ORDER — OXYTOCIN-SODIUM CHLORIDE 30-0.9 UT/500ML-% IV SOLN
2.5000 [IU]/h | INTRAVENOUS | Status: AC
  Administered 2022-04-18: 2.5 [IU]/h via INTRAVENOUS
  Filled 2022-04-17: qty 500

## 2022-04-17 MED ORDER — DIPHENHYDRAMINE HCL 25 MG PO CAPS: 25.0000 mg | ORAL_CAPSULE | Freq: Four times a day (QID) | ORAL | Status: DC | PRN

## 2022-04-17 MED ORDER — ACETAMINOPHEN 10 MG/ML IV SOLN
INTRAVENOUS | Status: DC | PRN
  Administered 2022-04-17: 1000 mg via INTRAVENOUS

## 2022-04-17 MED ORDER — PRENATAL MULTIVITAMIN CH
1.0000 | ORAL_TABLET | Freq: Every day | ORAL | Status: DC
  Administered 2022-04-18 – 2022-04-19 (×2): 1 via ORAL
  Filled 2022-04-17 (×2): qty 1

## 2022-04-17 MED ORDER — HYDROMORPHONE HCL 1 MG/ML IJ SOLN: 0.2000 mg | INTRAMUSCULAR | Status: DC | PRN

## 2022-04-17 MED ORDER — FAMOTIDINE 20 MG PO TABS
20.0000 mg | ORAL_TABLET | Freq: Two times a day (BID) | ORAL | Status: DC
  Administered 2022-04-17 – 2022-04-19 (×4): 20 mg via ORAL
  Filled 2022-04-17 (×4): qty 1

## 2022-04-17 MED ORDER — MENTHOL 3 MG MT LOZG: 1.0000 | LOZENGE | OROMUCOSAL | Status: DC | PRN

## 2022-04-17 MED ORDER — ONDANSETRON HCL 4 MG/2ML IJ SOLN
4.0000 mg | Freq: Three times a day (TID) | INTRAMUSCULAR | Status: DC | PRN
  Filled 2022-04-17: qty 2

## 2022-04-17 MED ORDER — ONDANSETRON HCL 4 MG/2ML IJ SOLN
INTRAMUSCULAR | Status: AC
  Filled 2022-04-17: qty 2

## 2022-04-17 SURGICAL SUPPLY — 33 items
BENZOIN TINCTURE PRP APPL 2/3 (GAUZE/BANDAGES/DRESSINGS) IMPLANT
CHLORAPREP W/TINT 26 (MISCELLANEOUS) ×2 IMPLANT
CLAMP UMBILICAL CORD (MISCELLANEOUS) ×1 IMPLANT
CLOTH BEACON ORANGE TIMEOUT ST (SAFETY) ×1 IMPLANT
DERMABOND ADVANCED .7 DNX12 (GAUZE/BANDAGES/DRESSINGS) IMPLANT
DRSG OPSITE POSTOP 4X10 (GAUZE/BANDAGES/DRESSINGS) ×1 IMPLANT
ELECT REM PT RETURN 9FT ADLT (ELECTROSURGICAL) ×1
ELECTRODE REM PT RTRN 9FT ADLT (ELECTROSURGICAL) ×1 IMPLANT
EXTRACTOR VACUUM M CUP 4 TUBE (SUCTIONS) IMPLANT
GAUZE SPONGE 4X4 12PLY STRL LF (GAUZE/BANDAGES/DRESSINGS) IMPLANT
GLOVE BIO SURGEON STRL SZ7.5 (GLOVE) ×1 IMPLANT
GLOVE BIOGEL PI IND STRL 7.0 (GLOVE) ×1 IMPLANT
GOWN STRL REUS W/TWL LRG LVL3 (GOWN DISPOSABLE) ×2 IMPLANT
KIT ABG SYR 3ML LUER SLIP (SYRINGE) ×1 IMPLANT
NDL HYPO 25X5/8 SAFETYGLIDE (NEEDLE) ×1 IMPLANT
NEEDLE HYPO 25X5/8 SAFETYGLIDE (NEEDLE) ×1 IMPLANT
NS IRRIG 1000ML POUR BTL (IV SOLUTION) ×1 IMPLANT
PACK C SECTION WH (CUSTOM PROCEDURE TRAY) ×1 IMPLANT
PAD ABD 7.5X8 STRL (GAUZE/BANDAGES/DRESSINGS) IMPLANT
PAD OB MATERNITY 4.3X12.25 (PERSONAL CARE ITEMS) ×1 IMPLANT
RETRACTOR TRAXI PANNICULUS (MISCELLANEOUS) IMPLANT
STRIP CLOSURE SKIN 1/2X4 (GAUZE/BANDAGES/DRESSINGS) IMPLANT
SUT MNCRL 0 VIOLET CTX 36 (SUTURE) ×4 IMPLANT
SUT MONOCRYL 0 CTX 36 (SUTURE) ×4
SUT PDS AB 0 CTX 60 (SUTURE) ×1 IMPLANT
SUT PLAIN 0 NONE (SUTURE) IMPLANT
SUT PLAIN 2 0 (SUTURE)
SUT PLAIN 2 0 XLH (SUTURE) IMPLANT
SUT PLAIN ABS 2-0 CT1 27XMFL (SUTURE) IMPLANT
SUT VIC AB 4-0 KS 27 (SUTURE) ×1 IMPLANT
TOWEL OR 17X24 6PK STRL BLUE (TOWEL DISPOSABLE) ×1 IMPLANT
TRAY FOLEY W/BAG SLVR 14FR LF (SET/KITS/TRAYS/PACK) ×1 IMPLANT
WATER STERILE IRR 1000ML POUR (IV SOLUTION) ×1 IMPLANT

## 2022-04-17 NOTE — Transfer of Care (Signed)
Immediate Anesthesia Transfer of Care Note  Patient: Rebekah [name]  Procedure(s) Performed: CESAREAN SECTION  Patient Location: PACU  Anesthesia Type:Spinal  Level of Consciousness: awake, alert , oriented, and patient cooperative  Airway & Oxygen Therapy: Patient Spontanous Breathing  Post-op Assessment: Report given to RN and Post -op Vital signs reviewed and stable  Post vital signs: Reviewed and stable  Last Vitals:  Vitals Value Taken Time  BP    Temp    Pulse 73 04/17/22 1951  Resp 16 04/17/22 1951  SpO2 94 % 04/17/22 1951  Vitals shown include unvalidated device data.  Last Pain:  Vitals:   04/17/22 1730  TempSrc:   PainSc: 0-No pain         Complications: No notable events documented.

## 2022-04-17 NOTE — Progress Notes (Signed)
No HA, no vision change  Today's Vitals   04/17/22 0550 04/17/22 0720 04/17/22 0843 04/17/22 0900  BP: 135/73 (!) 148/90 (!) 138/93   Pulse: 75 83 93   Resp: 18  16   Temp: 97.8 F (36.6 C) 98.1 F (36.7 C)    TempSrc: Oral Oral    SpO2:      PainSc:  1   0-No pain   There is no height or weight on file to calculate BMI.   FHT cat one UCs q2-4 min, mild  Cx dimple/50/-3/mod to mushy soft BSUS>Vtx  Labs pending  A/P: D/W patient         One more dose of misoprostol, 4 hours later start pitocin

## 2022-04-17 NOTE — Progress Notes (Signed)
FHT cat one UCs q1-4 min Cx no change  dimple/50/very high vtx  Pitocin infusing  A/P; now at about 24 hours with no cx change and a persistently high vtx D/W options including continue pitocin, stop pitocin>rest>repeat misoprostol, or a C/S now. She states she understands and wants a C/S now. D/W procedure and risks-including infection, organ damage, bleeding/transfusion-HIV/Hep, DVT/PE, pneumonia, wound breakdown. She states she understands and agrees.

## 2022-04-17 NOTE — Progress Notes (Signed)
No HA or vision change No ROM, no bleeding  Today's Vitals   04/16/22 2349 04/17/22 0152 04/17/22 0550 04/17/22 0720  BP: 130/68 112/63 135/73 (!) 148/90  Pulse: 66 70 75 83  Resp:  18 18   Temp:  98.3 F (36.8 C) 97.8 F (36.6 C) 98.1 F (36.7 C)  TempSrc:  Oral Oral Oral  SpO2:      PainSc:  0-No pain  1    There is no height or weight on file to calculate BMI.   FHT cat one UCs Q3-6 min, mild  Misoprostol 50 mcg buccal q4 hours x 4 doses, last dose about 5:45 am  A/P: D/W patient two stage IOL         Will check cervix about 10 am

## 2022-04-17 NOTE — Anesthesia Postprocedure Evaluation (Signed)
Anesthesia Post Note  Patient: Rebekah [name]  Procedure(s) Performed: CESAREAN SECTION     Patient location during evaluation: PACU Anesthesia Type: Spinal Level of consciousness: awake and alert Pain management: pain level controlled Vital Signs Assessment: post-procedure vital signs reviewed and stable Respiratory status: spontaneous breathing, nonlabored ventilation and respiratory function stable Cardiovascular status: blood pressure returned to baseline Postop Assessment: no apparent nausea or vomiting, spinal receding, no headache and no backache Anesthetic complications: no   No notable events documented.  Last Vitals:  Vitals:   04/17/22 2035 04/17/22 2116  BP: 123/71 129/77  Pulse: 80 (!) 55  Resp: 18 18  Temp:  37 C  SpO2: (!) 86% 100%    Last Pain:  Vitals:   04/17/22 2116  TempSrc: Oral  PainSc:    Pain Goal:    LLE Motor Response: Purposeful movement (04/17/22 2035) LLE Sensation: Decreased (04/17/22 2035) RLE Motor Response: Purposeful movement (04/17/22 2035) RLE Sensation: Decreased (04/17/22 2035)     Epidural/Spinal Function Cutaneous sensation: Able to Wiggle Toes (04/17/22 2035), Patient able to flex knees: Yes (04/17/22 2035), Patient able to lift hips off bed: No (04/17/22 2035), Back pain beyond tenderness at insertion site: No (04/17/22 2035), Progressively worsening motor and/or sensory loss: No (04/17/22 2035), Bowel and/or bladder incontinence post epidural: No (04/17/22 2035)  Marthenia Rolling

## 2022-04-17 NOTE — Op Note (Unsigned)
Rebekah, [name] MEDICAL RECORD NO: [id number] ACCOUNT NO: [id number] DATE OF BIRTH: 1985/06/07 FACILITY: [facility] LOCATION: [facility] PHYSICIAN: Daleen Bo. Lyn Hollingshead, MD  Operative Report   DATE OF PROCEDURE: 04/17/2022  PREOPERATIVE DIAGNOSIS:    1.  Gestational hypertension.  2.  Failed induction of labor.   POSTOPERATIVE DIAGNOSIS:   1.  Gestational hypertension.  2.  Failed induction of labor.   PROCEDURE:  A low transverse cesarean.  ASSISTANT:  Dr. Everlene Farrier.  ANESTHESIA:  Spinal.  ANESTHESIOLOGIST:  Dr. Veronda Prude.  ESTIMATED BLOOD LOSS:  Per anesthesiology note.  FINDINGS:  Viable female infant, Apgars, arterial cord pH, birth weight pending.    INDICATIONS AND CONSENT:  This patient is a 37 year old G1 P0 at 95 and 6/7 weeks.  She is admitted with gestational hypertension for 2-stage induction of labor.  She undergoes multiple doses of misoprostol and then Pitocin.  After about 24 hours of  induction head remains extremely high with no change in the cervix.  After carefully discussing options, she opts for cesarean section.  The procedure and risks and complications are discussed preoperatively including but not limited to infection, organ  damage, bleeding requiring transfusion of blood products with HIV and hepatitis acquisition, DVT, PE, pneumonia, wound breakdown.  She states she understands and agrees and consent is signed on the chart.  DESCRIPTION OF PROCEDURE:  The patient was taken to the operating room where she was identified.  Spinal anesthetic was placed and she was placed in the dorsal supine position with a 15-degree left lateral wedge.  She was prepped vaginally with Betadine.   Foley catheter was placed.  Traxi was used to elevate the panniculus.  She was prepped abdominally with ChloraPrep.  Timeout was done.  After a 3-minute drying time, she was draped in a sterile fashion.  Intravenous Ancef has been administered.  After  testing for adequate spinal  anesthesia skin was entered through a Pfannenstiel incision and dissection was carried out in layers to the peritoneum, which was taken down superiorly and inferiorly.  Vesicouterine peritoneum was taken down cephalad  laterally.  Bladder flap developed and the bladder blade was placed.  Uterus was incised in a low transverse manner and the uterine cavity was entered bluntly with a hemostat.  The incision is extended with the fingers.  Artificial rupture of membranes  done for clear fluid.  Baby was then delivered from the vertex position without difficulty.  Good cry and tone is noted.  After 1 minute cord was clamped and cut and the baby was handed to waiting pediatrics team.  Placenta was delivered.  Uterine cavity  is clean.  Uterus was closed in 2 running locking imbricating layers of 0 Monocryl suture, which achieved good hemostasis.  Lavage was carried out.  Anterior peritoneum was closed in running fashion with 0 Monocryl suture which was also used to  reapproximate the pyramidalis muscle in the midline.  The fascia was closed in a running fashion with a 0 looped PDS.  Subcutaneous layer was closed with interrupted plain.  Skin was closed in a subcuticular fashion with a 4-0 Vicryl stitch on a Keith  needle.  Benzoin, Steri-Strips, honeycomb and pressure dressing is applied.  All counts were correct.  The patient was taken to recovery room in stable condition.     SUJ D: 04/17/2022 7:46:24 pm T: 04/17/2022 9:54:00 pm  JOB: [id number]/ [id number]

## 2022-04-17 NOTE — Anesthesia Preprocedure Evaluation (Signed)
Anesthesia Evaluation  Patient identified by MRN, date of birth, ID band Patient awake    Reviewed: Allergy & Precautions, NPO status , Patient's Chart, lab work & pertinent test results  Airway Mallampati: III  TM Distance: >3 FB Neck ROM: Full    Dental no notable dental hx. (+) Teeth Intact, Dental Advisory Given   Pulmonary neg pulmonary ROS   Pulmonary exam normal breath sounds clear to auscultation       Cardiovascular hypertension (gHtn), Pt. on medications Normal cardiovascular exam Rhythm:Regular Rate:Normal  Implantable loop recorder 04/17/2021 Atrial tachycardia and prolonged QT syndrome   Neuro/Psych  Headaches  Anxiety        GI/Hepatic negative GI ROS, Neg liver ROS,,,  Endo/Other  negative endocrine ROS    Renal/GU negative Renal ROS     Musculoskeletal   Abdominal  (+) + obese  Peds  Hematology Lab Results      Component                Value               Date                      WBC                      11.7 (H)            04/17/2022                HGB                      12.5                04/17/2022                HCT                      35.1 (L)            04/17/2022                MCV                      89.8                04/17/2022                PLT                      218                 04/17/2022              Anesthesia Other Findings   Reproductive/Obstetrics (+) Pregnancy                             Anesthesia Physical Anesthesia Plan  ASA: 3  Anesthesia Plan: Spinal   Post-op Pain Management: Regional block* and Minimal or no pain anticipated   Induction:   PONV Risk Score and Plan: 3 and Treatment may vary due to age or medical condition, Ondansetron and Dexamethasone  Airway Management Planned: Natural Airway and Nasal Cannula  Additional Equipment: None  Intra-op Plan:   Post-operative Plan:   Informed Consent: I have reviewed the  patients History and Physical, chart, labs and discussed the procedure including the  risks, benefits and alternatives for the proposed anesthesia with the patient or authorized representative who has indicated his/her understanding and acceptance.     Dental advisory given  Plan Discussed with:   Anesthesia Plan Comments: (37.6 wk primagravida w BMI 41 & gHtn for spinal for failure to progress)        Anesthesia Quick Evaluation

## 2022-04-17 NOTE — Brief Op Note (Signed)
04/16/2022 - 04/17/2022  7:36 PM  PATIENT:  Rebekah [name]  37 y.o. female  PRE-OPERATIVE DIAGNOSIS:  FAILED INDUCTION OF LABOR  POST-OPERATIVE DIAGNOSIS:  FAILED INDUCTION OF LABOR  PROCEDURE:  Procedure(s): CESAREAN SECTION (N/A)  SURGEON:  Surgeon(s) and Role:    * Everlene Farrier, MD - Primary    * Mercado-Ortiz, Ernestine Conrad, DO - Assisting  PHYSICIAN ASSISTANT:   ASSISTANTS: none   ANESTHESIA:   spinal  EBL:  per anesthesiology note   BLOOD ADMINISTERED:none  DRAINS: Urinary Catheter (Foley)   LOCAL MEDICATIONS USED:  NONE  SPECIMEN:  Source of Specimen:  placenta  DISPOSITION OF SPECIMEN:  PATHOLOGY  COUNTS:  YES  TOURNIQUET:  * No tourniquets in log *  DICTATION: .Other Dictation: Dictation Number   [id number]  PLAN OF CARE: Admit to inpatient   PATIENT DISPOSITION:  PACU - hemodynamically stable.   Delay start of Pharmacological VTE agent (>24hrs) due to surgical blood loss or risk of bleeding: not applicable

## 2022-04-18 ENCOUNTER — Encounter (HOSPITAL_COMMUNITY): Payer: Self-pay | Admitting: Obstetrics and Gynecology

## 2022-04-18 LAB — CBC
HCT: 33.6 % — ABNORMAL LOW (ref 36.0–46.0)
Hemoglobin: 11.9 g/dL — ABNORMAL LOW (ref 12.0–15.0)
MCH: 31.2 pg (ref 26.0–34.0)
MCHC: 35.4 g/dL (ref 30.0–36.0)
MCV: 88 fL (ref 80.0–100.0)
Platelets: 206 10*3/uL (ref 150–400)
RBC: 3.82 MIL/uL — ABNORMAL LOW (ref 3.87–5.11)
RDW: 13.5 % (ref 11.5–15.5)
WBC: 16.1 10*3/uL — ABNORMAL HIGH (ref 4.0–10.5)
nRBC: 0 % (ref 0.0–0.2)

## 2022-04-18 MED ORDER — LACTATED RINGERS IV BOLUS
250.0000 mL | Freq: Once | INTRAVENOUS | Status: AC
  Administered 2022-04-18: 250 mL via INTRAVENOUS

## 2022-04-18 MED ORDER — METOPROLOL TARTRATE 50 MG PO TABS
50.0000 mg | ORAL_TABLET | Freq: Two times a day (BID) | ORAL | Status: DC
  Administered 2022-04-18 – 2022-04-19 (×3): 50 mg via ORAL
  Filled 2022-04-18 (×4): qty 1

## 2022-04-18 MED ORDER — METOCLOPRAMIDE HCL 5 MG/ML IJ SOLN
10.0000 mg | Freq: Once | INTRAMUSCULAR | Status: AC
  Administered 2022-04-18: 10 mg via INTRAVENOUS
  Filled 2022-04-18: qty 2

## 2022-04-18 NOTE — Progress Notes (Signed)
Pt noted to have several episodes of emesis throughout the night. Pt states, "I get really hot and start sweating, and then it comes in waves." [place] MD notified and new orders were given with special concern to avoid QT prolonging meds due to pt's preexisting medical conditions. Diet order placed for clear fluids only at this time. Pt able to take small sips of water without vomiting. V/S stable; urine output within normal range; no further s/s of distress. Will continue to monitor pt and notify MD as needed.

## 2022-04-18 NOTE — Lactation Note (Signed)
This note was copied from a baby's chart. Lactation Consultation Note  Patient Name: Rebekah [name] [id number] Date: 04/18/2022 Reason for consult: Initial assessment;1st time breastfeeding;Early term 37-38.6wks;Maternal endocrine disorder-PCOS, C/S delivery see Birth Parent-MR Age:36 hours, female, ETI . Birth Parent attempted latch infant on her left breast using the football hold position, infant did not latch, sleepy at the breast, LC explained this is normal 1st day of life. Birth Parent was taught hand expression, LC used breast model and Birth Parent easily self expressed 4 mls of colostrum that was spoon feed to infant. After attempting to latch infant, Birth Parent had vomiting, LC informed Therapist, sports. Birth Parent will continue to work towards latching infant at the breast, breastfeeding by hunger cues, on demand, 8+ times within 24 hours, STS. Birth Parent knows to call [place] for further latch assistance if needed. LC discussed infant's I&O with Birth Parent. Mom made aware of O/P services, breastfeeding support groups, community resources, and our phone # for post-discharge questions.   Maternal Data Has patient been taught Hand Expression?: Yes  Feeding Mother's Current Feeding Choice: Breast Milk  LATCH Score Latch: Too sleepy or reluctant, no latch achieved, no sucking elicited.  Audible Swallowing: None  Type of Nipple: Everted at rest and after stimulation  Comfort (Breast/Nipple): Soft / non-tender  Hold (Positioning): Assistance needed to correctly position infant at breast and maintain latch.  LATCH Score: 5   Lactation Tools Discussed/Used    Interventions Interventions: Breast feeding basics reviewed;Assisted with latch;Skin to skin;Breast compression;Adjust position;Support pillows;Position options;Expressed milk;Education;LC Services brochure  Discharge Pump: DEBP;Personal (Per Birth Parent she has Elvie DEBP at home.)  Consult Status Consult Status:  Follow-up Date: 04/18/22 Follow-up type: In-patient    Eulis Canner 04/18/2022, 12:11 AM

## 2022-04-18 NOTE — Progress Notes (Signed)
POD # 1   Feeling better. Tolerating liquids.  BP 126/82 (BP Location: Right Arm)   Pulse (!) 54   Temp (!) 97.5 F (36.4 C) (Oral)   Resp 17   Wt 113.4 kg   SpO2 97%   Breastfeeding Unknown   BMI 39.16 kg/m  Results for orders placed or performed during the hospital encounter of 04/16/22 (from the past 24 hour(s))  Comprehensive metabolic panel     Status: Abnormal   Collection Time: 04/17/22  8:40 AM  Result Value Ref Range   Sodium 135 135 - 145 mmol/L   Potassium 3.4 (L) 3.5 - 5.1 mmol/L   Chloride 105 98 - 111 mmol/L   CO2 21 (L) 22 - 32 mmol/L   Glucose, Bld 82 70 - 99 mg/dL   BUN <5 (L) 6 - 20 mg/dL   Creatinine, Ser 0.57 0.44 - 1.00 mg/dL   Calcium 8.6 (L) 8.9 - 10.3 mg/dL   Total Protein 5.7 (L) 6.5 - 8.1 g/dL   Albumin 2.8 (L) 3.5 - 5.0 g/dL   AST 19 15 - 41 U/L   ALT 14 0 - 44 U/L   Alkaline Phosphatase 93 38 - 126 U/L   Total Bilirubin 0.4 0.3 - 1.2 mg/dL   GFR, Estimated >60 >60 mL/min   Anion gap 9 5 - 15  CBC     Status: Abnormal   Collection Time: 04/17/22  8:40 AM  Result Value Ref Range   WBC 11.2 (H) 4.0 - 10.5 K/uL   RBC 3.85 (L) 3.87 - 5.11 MIL/uL   Hemoglobin 12.0 12.0 - 15.0 g/dL   HCT 34.8 (L) 36.0 - 46.0 %   MCV 90.4 80.0 - 100.0 fL   MCH 31.2 26.0 - 34.0 pg   MCHC 34.5 30.0 - 36.0 g/dL   RDW 14.0 11.5 - 15.5 %   Platelets 213 150 - 400 K/uL   nRBC 0.0 0.0 - 0.2 %  CBC     Status: Abnormal   Collection Time: 04/17/22  4:25 PM  Result Value Ref Range   WBC 11.7 (H) 4.0 - 10.5 K/uL   RBC 3.91 3.87 - 5.11 MIL/uL   Hemoglobin 12.5 12.0 - 15.0 g/dL   HCT 35.1 (L) 36.0 - 46.0 %   MCV 89.8 80.0 - 100.0 fL   MCH 32.0 26.0 - 34.0 pg   MCHC 35.6 30.0 - 36.0 g/dL   RDW 13.8 11.5 - 15.5 %   Platelets 218 150 - 400 K/uL   nRBC 0.0 0.0 - 0.2 %  CBC     Status: Abnormal   Collection Time: 04/18/22  3:52 AM  Result Value Ref Range   WBC 16.1 (H) 4.0 - 10.5 K/uL   RBC 3.82 (L) 3.87 - 5.11 MIL/uL   Hemoglobin 11.9 (L) 12.0 - 15.0 g/dL   HCT  33.6 (L) 36.0 - 46.0 %   MCV 88.0 80.0 - 100.0 fL   MCH 31.2 26.0 - 34.0 pg   MCHC 35.4 30.0 - 36.0 g/dL   RDW 13.5 11.5 - 15.5 %   Platelets 206 150 - 400 K/uL   nRBC 0.0 0.0 - 0.2 %   Abdomen dressing removed Honeycomb is loose and saturated with blood  No active bleeding  POD # 1  Doing well Routine care Hydrate Remove Foley  Replace honeycomb dressing Discharge home tomorrow

## 2022-04-18 NOTE — Lactation Note (Signed)
This note was copied from a baby's chart. Lactation Consultation Note  Patient Name: Rebekah [name] [id number] Date: 04/18/2022 Reason for consult: Follow-up assessment;Primapara;1st time breastfeeding;Early term 37-38.6wks;Breastfeeding assistance;Infant weight loss;Maternal endocrine disorder (1.3% WL) Age:36 hours  LC entered the room and the infant was STS with the birth parent.  Per the birth parent, the infant just fed for 15 min on the left breast.  She stated that the infant did well and denies any pain when breastfeeding.  The birth parent commented that she did have some uterine cramping.  LC informed the birth parent that the cramping is normal.  The birth parent had no questions or concerns.  LC put her name on the board and encouraged the birth parent to call [name] for assistance with breastfeeding.   Interventions Interventions: Education  Consult Status Consult Status: Follow-up Date: 04/19/22 Follow-up type: In-patient   Elly Modena Zenaida Tesar 04/18/2022, 9:06 AM

## 2022-04-18 NOTE — Progress Notes (Signed)
MOB was referred for history of depression/anxiety. * Referral screened out by Clinical Social Worker because none of the following criteria appear to apply: ~ History of anxiety/depression during this pregnancy, or of post-partum depression following prior delivery. ~ Diagnosis of anxiety and/or depression within last 3 years. No concerns were noted in OB records.  OR * MOB's symptoms currently being treated with medication and/or therapy.  Please contact the Clinical Social Worker if needs arise, by [facility] request, or if MOB scores greater than 9/yes to question 10 on Edinburgh Postpartum Depression Screen.   Laurey Arrow, MSW, LCSW Clinical Social Work [phone]

## 2022-04-19 MED ORDER — IBUPROFEN 600 MG PO TABS: 600.0000 mg | ORAL_TABLET | Freq: Four times a day (QID) | ORAL | 0 refills | Status: AC | PRN

## 2022-04-19 MED ORDER — DOCUSATE SODIUM 100 MG PO CAPS: 100.0000 mg | ORAL_CAPSULE | Freq: Two times a day (BID) | ORAL | 2 refills | Status: AC

## 2022-04-19 MED ORDER — OXYCODONE HCL 5 MG PO TABS: 5.0000 mg | ORAL_TABLET | ORAL | 0 refills | Status: AC | PRN

## 2022-04-19 NOTE — Discharge Summary (Signed)
Postpartum Discharge Summary  Date of Service updated 04/19/22     Patient Name: Rebekah [name] DOB: December 10, 1985 MRN: [id number]  Date of admission: 04/16/2022 Delivery date:04/17/2022  Delivering provider: Everlene Farrier  Date of discharge: 04/19/2022  Admitting diagnosis: Gestational hypertension [O13.9] Intrauterine pregnancy: [redacted]w[redacted]d     Secondary diagnosis:  Principal Problem:   Gestational hypertension  Additional problems: None    Discharge diagnosis: Term Pregnancy Delivered and Gestational Hypertension                                              Augmentation: AROM, Pitocin, and Cytotec Complications: None  Hospital course: Induction of Labor With Cesarean Section   37 y.o. yo G1P1001 at [redacted]w[redacted]d was admitted to the hospital 04/16/2022 for induction of labor. Patient had a labor course significant for arrest of dilation. The patient went for cesarean section due to Arrest of Dilation. Delivery details are as follows: Membrane Rupture Time/Date: 6:50 PM ,04/17/2022   Delivery Method:C-Section, Low Transverse  Details of operation can be found in separate operative Note.  Patient had a postpartum course complicated by None. She is ambulating, tolerating a regular diet, passing flatus, and urinating well.  Patient is discharged home in stable condition on 04/19/22.      Newborn Data: Birth date:04/17/2022  Birth time:6:50 PM  Gender:Female  Living status:Living  Apgars:8 ,9  Weight:3070 g                                Magnesium Sulfate received: No BMZ received: No Rhophylac:N/A MMR:N/A T-DaP:Given prenatally Flu: N/A Transfusion:No  Physical exam  Vitals:   04/18/22 1233 04/18/22 2152 04/18/22 2300 04/19/22 0510  BP: 126/67 (!) 140/59 128/74 127/71  Pulse: 60 74 80 69  Resp: 18 14 16 16   Temp: 98 F (36.7 C) (!) 97.5 F (36.4 C) 98.1 F (36.7 C) 98.1 F (36.7 C)  TempSrc: Oral Oral Oral Oral  SpO2: 99% 97% 99% 99%  Weight:       General: alert and  cooperative Lochia: appropriate Uterine Fundus: firm Incision: Healing well with no significant drainage DVT Evaluation: No evidence of DVT seen on physical exam. Labs: Lab Results  Component Value Date   WBC 16.1 (H) 04/18/2022   HGB 11.9 (L) 04/18/2022   HCT 33.6 (L) 04/18/2022   MCV 88.0 04/18/2022   PLT 206 04/18/2022      Latest Ref Rng & Units 04/17/2022    8:40 AM  CMP  Glucose 70 - 99 mg/dL 82   BUN 6 - 20 mg/dL <5   Creatinine 0.44 - 1.00 mg/dL 0.57   Sodium 135 - 145 mmol/L 135   Potassium 3.5 - 5.1 mmol/L 3.4   Chloride 98 - 111 mmol/L 105   CO2 22 - 32 mmol/L 21   Calcium 8.9 - 10.3 mg/dL 8.6   Total Protein 6.5 - 8.1 g/dL 5.7   Total Bilirubin 0.3 - 1.2 mg/dL 0.4   Alkaline Phos 38 - 126 U/L 93   AST 15 - 41 U/L 19   ALT 0 - 44 U/L 14    Edinburgh Score:    04/18/2022   12:33 PM  Edinburgh Postnatal Depression Scale Screening Tool  I have been able to laugh and see the funny side of things. 0  I have looked forward with enjoyment to things. 0  I have blamed myself unnecessarily when things went wrong. 0  I have been anxious or worried for no good reason. 1  I have felt scared or panicky for no good reason. 0  Things have been getting on top of me. 0  I have been so unhappy that I have had difficulty sleeping. 1  I have felt sad or miserable. 0  I have been so unhappy that I have been crying. 0  The thought of harming myself has occurred to me. 0  Edinburgh Postnatal Depression Scale Total 2      After visit meds:  Allergies as of 04/19/2022       Reactions   Pertussis Vaccine Acellular Other (See Comments)   unknown childhood reaction        Medication List     STOP taking these medications    aspirin EC 81 MG tablet       TAKE these medications    docusate sodium 100 MG capsule Commonly known as: Colace Take 1 capsule (100 mg total) by mouth 2 (two) times daily.   famotidine 40 MG tablet Commonly known as: PEPCID Take 40 mg by  mouth daily.   ibuprofen 600 MG tablet Commonly known as: ADVIL Take 1 tablet (600 mg total) by mouth every 6 (six) hours as needed.   metoprolol tartrate 50 MG tablet Commonly known as: LOPRESSOR Take 50 mg by mouth 2 (two) times daily.   oxyCODONE 5 MG immediate release tablet Commonly known as: Oxy IR/ROXICODONE Take 1 tablet (5 mg total) by mouth every 4 (four) hours as needed for severe pain.   prenatal multivitamin Tabs tablet Take 1 tablet by mouth daily at 12 noon.   VITAMIN D PO Take 5,000 Units by mouth daily.         Discharge home in stable condition Infant Feeding: Bottle and Breast Infant Disposition:home with mother Discharge instruction: per After Visit Summary and Postpartum booklet. Activity: Advance as tolerated. Pelvic rest for 6 weeks.  Diet: routine diet Anticipated Birth Control: Unsure Postpartum Appointment:6 weeks Additional Postpartum F/U: BP check 2-3 days Future Appointments:No future appointments. Follow up Visit:      04/19/2022 Tyson Dense, MD

## 2022-04-19 NOTE — Lactation Note (Signed)
This note was copied from a baby's chart. Lactation Consultation Note  Patient Name: Rebekah [name] TOIZT'I Date: 04/19/2022 Age : 36 hours old  Reason for consult: Follow-up assessment;Primapara;1st time breastfeeding;Early term 37-38.6wks;Infant weight loss;Nipple pain/trauma;Breastfeeding assistance (5 % weight loss, skin Bilirubin 10.8 , Serum Bili pending) Serum Bili - 8.9 @35  hours 38 mins.  Last feeding was at 5 am.  Baby awake and hungry, LC offered to assist and mom receptive.  Baby STS, and LC noted areola edema, and showed mom the reverse pressure stretch back and the areola soften enough to obtain the depth. Baby fed 15 mins with multiple swallows and increased with breast compressions. Per mom comfortable. Moist heat compress above the baby on the breast helped too.  [place] Plan to enhance consistent depth latching:  Shells while awake until the areola is consistently compressible like a thinner sandwich.  Prior to latching on the 1st breast -  Breast massage, hand express , pre-pump to stretch the nipple / areola complex  and reverse pressure.  Latch with firm support and work with the baby to open wide and then latch.  Football may be a good position to use until the baby is latching well.  If baby is still hungry after the 1st breast offer the 2nd breast .  Sore nipple and engorgement prevention and tx.  LC recommended if needed to connect with the [facility] in her [place] office.   Maternal Data Has patient been taught Hand Expression?: Yes Does the patient have breastfeeding experience prior to this delivery?: No  Feeding Mother's Current Feeding Choice: Breast Milk and Formula  LATCH Score Latch: Repeated attempts needed to sustain latch, nipple held in mouth throughout feeding, stimulation needed to elicit sucking reflex.  Audible Swallowing: Spontaneous and intermittent  Type of Nipple: Everted at rest and after stimulation (areola edema)  Comfort (Breast/Nipple): Soft  / non-tender  Hold (Positioning): Assistance needed to correctly position infant at breast and maintain latch.  LATCH Score: 8   Lactation Tools Discussed/Used Tools: Shells;Pump;Flanges;Coconut oil Flange Size: 21;24;27;Other (comment) (#27 F just incase its needed when the milk comes in) Breast pump type: Manual Pump Education: Other (comment) (already has the hand pump , shells provided with instruction by this [place]) Reason for Pumping: Due to areola edema - LC recommended steps for latching - 1st breast , massage, hand express, pre-pump with the hand pump and stretch back, Breast between feedings except when sleeping until areolas are more compressible  Interventions Interventions: Breast feeding basics reviewed;Assisted with latch;Skin to skin;Breast massage;Hand express;Pre-pump if needed;Reverse pressure;Breast compression;Adjust position;Support pillows;Position options;Coconut oil;Shells;Hand pump;Education;LC Services brochure  Discharge Discharge Education: Engorgement and breast care;Warning signs for feeding baby;Outpatient recommendation;Other (comment) (per mom will be going to [place] and [place] recommended to check with [place] office and connect with her if needed.) Pump: Personal;Hands Free;Manual [facility] Program: No  Consult Status Consult Status: Complete Date: 04/19/22    Myer Haff 04/19/2022, 9:18 AM

## 2022-04-20 LAB — SURGICAL PATHOLOGY

## 2022-04-24 ENCOUNTER — Ambulatory Visit: Payer: PRIVATE HEALTH INSURANCE

## 2022-04-24 ENCOUNTER — Telehealth (HOSPITAL_COMMUNITY): Payer: Self-pay

## 2022-04-24 ENCOUNTER — Other Ambulatory Visit: Payer: PRIVATE HEALTH INSURANCE

## 2022-04-24 NOTE — Telephone Encounter (Signed)
Patient reports feeling good. Patient declines questions/concerns about her health and healing.  Patient reports that baby is doing well. Eating, peeing/pooping, and gaining weight well. Baby sleeps in a bassinet. RN reviewed ABC's of safe sleep with patient. Patient declines any questions or concerns about baby.  EPDS score is 2.  Sharyn Lull [facility] 04/24/22,1353

## 2022-05-02 ENCOUNTER — Inpatient Hospital Stay (HOSPITAL_COMMUNITY)
Admission: AD | Admit: 2022-05-02 | Payer: PRIVATE HEALTH INSURANCE | Source: Home / Self Care | Admitting: Obstetrics and Gynecology

## 2022-06-08 ENCOUNTER — Ambulatory Visit: Payer: Self-pay

## 2022-06-08 NOTE — Lactation Note (Signed)
This note was copied from a baby's chart. Lactation Consultation Note  Patient Name: Brylee Hillen [id number] Date: 06/08/2022 Age:35 wk.o. Reason for consult: Initial assessment;Other (Comment) (peds admission)  LC into room pediatric admission. Lactating parent states she has a heavy letdown and oversupply. Per parent, infant has a clicking sound when nursing, seems to be full after a couple of minutes, explosive stool, and reflux. Lactating parent has been block feeding. LP reports slight improvement but excessive supply is still present.  Discussed ways to decrease milk supply such as continue block feeding, partially emptying the breast, using cabbage leaves 1 or 2x per day, wearing a sports bra, etc. Explored the idea of using a nipple shield however LC reinforced working with an [place] to monitor progress and when to discontinue NS use. Reinforced other feeding positions like side-lying and bio-natural, to improve the effects of gravity.   Parent has lots of milk stored. [place] talked briefly about donating stored breast milk if needed. Reviewed local resources for support after discharge.   Feeding Mother's Current Feeding Choice: Breast Milk  LATCH Score No latch observed during this encounter. Infant is very sleepy.  Lactation Tools Discussed/Used Tools: Pump;Flanges Flange Size: 21 Breast pump type: Manual Pump Education: Milk Storage;Setup, frequency, and cleaning Reason for Pumping: for comfort and decrease let down Pumping frequency: as needed to reduce Pumped volume: 90 mL (one breast, 5 minutes)  Interventions Interventions: Education;Expressed milk;Hand pump;DEBP  Discharge Discharge Education: Engorgement and breast care;Outpatient recommendation Pump: Personal;Manual WIC Program: No  Consult Status Consult Status: Complete    Arlanda Shiplett A Higuera [place]y 06/08/2022, 2:29 PM

## 2022-06-18 ENCOUNTER — Encounter (INDEPENDENT_AMBULATORY_CARE_PROVIDER_SITE_OTHER): Payer: Self-pay | Admitting: Pediatric Genetics

## 2022-06-18 NOTE — Progress Notes (Signed)
Evaluated Wafa's infant daughter in genetics clinic on 06/15/2022 given maternal personal + family history of long QT syndrome. Her daughter currently is asymptomatic. We advised testing Sereena first to potentially identify a genetic cause for the long QT, to be able to accurately assess risk/inheritance for her daughter. She last had genetic testing 10+ years ago that was nondiagnostic. Updated genes now known since last test, so it is possible we may now be able to identify a cause.  [facility] Comprehensive Arrhythmia panel sent for Rashay on 3/8. Anticipate results in 2-4 weeks.   Artist Pais, [place]

## 2022-07-17 ENCOUNTER — Ambulatory Visit (INDEPENDENT_AMBULATORY_CARE_PROVIDER_SITE_OTHER): Payer: PRIVATE HEALTH INSURANCE | Admitting: Pediatric Genetics

## 2022-07-17 DIAGNOSIS — I491 Atrial premature depolarization: Secondary | ICD-10-CM

## 2022-07-17 DIAGNOSIS — I4581 Long QT syndrome: Secondary | ICD-10-CM | POA: Diagnosis not present

## 2022-07-17 NOTE — Progress Notes (Signed)
MEDICAL GENETICS RESULTS VISIT  Patient name: Rebekah [name] DOB: Feb 06, 1986 Age: 37 y.o. MRN: [id number]  Initial Referring Provider/Specialty: Self Date of Evaluation: 07/17/2022 Chief Complaint/Reason for Referral: Long QT syndrome; arrhythmia  HPI: Rebekah [name] is a 37 y.o. female with long QT syndrome.  Rebekah [name] was diagnosed with long QT syndrome at 37 yo after a syncopal event in the shower. She is on beta blockers and has an implantable loop recorder. She underwent genetic testing in 2010 or 2012 that showed a variant of uncertain significance (gene unknown), but records of this test are not available. 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 She follows with Rebekah [name] (Dr. Sharol [name]). There is a significant family history of long QT syndrome in maternal relatives and sudden death in her father (see family history for more details).   In January 2024 Rebekah [name] gave birth to a daughter Rebekah [name]) through IVF and use of a sperm donor. Rebekah [name] has been healthy with normal EKGs (following with Rebekah [name]), and was seen in our genetics clinic 06/15/2022 given the family history of long QT syndrome. We recommended Rebekah [name] herself be tested through the [facility] Arrhythmia Comprehensive panel with add-on preliminary evidence genes for arrhythmia and SUDEP (80 genes total). 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 She comes to clinic today to discuss these results.  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 60 capsule 2   famotidine (PEPCID) 40 MG tablet Take 40 mg by mouth daily.     ibuprofen (ADVIL) 600 MG tablet Take 1 tablet (600 mg total) by mouth every 6 (six) hours as needed. 30 tablet 0   metoprolol tartrate (LOPRESSOR) 50 MG tablet Take 50 mg by mouth 2 (two) times daily.     oxyCODONE (OXY IR/ROXICODONE) 5 MG immediate release tablet Take 1 tablet (5 mg total) by mouth every 4 (four) hours as needed for severe pain. 15 tablet 0   Prenatal Vit-Fe Fumarate-FA (PRENATAL MULTIVITAMIN) TABS tablet Take 1 tablet by mouth daily at 12 noon.     VITAMIN D PO Take 5,000 Units by mouth daily.     No current facility-administered medications on file prior to visit.    Allergies:  Allergies  Allergen Reactions   Pertussis Vaccine Acellular Other (See Comments)    unknown childhood reaction   Review of Systems (updates in bold): General: No concerns Eyes/vision: No concerns Ears/hearing: No concerns Respiratory: No concerns Cardiovascular: Long QT; atrial arrhythmia Gastrointestinal: No concerns Genitourinary: No concerns Endocrine: PCOS Hematologic: No concerns Immunologic:  No concerns Neurological: No concerns Psychiatric: No concerns Musculoskeletal: No concerns  Family History: See pedigree below obtained during daughter Rebekah [name]'s visit:      Notable family history: Rebekah [name] has a 61 week old daughter Rebekah [name] who was conceived through IVF using a sperm donor. The donor was not known to have had any major personal or family health concerns and his genetic  carrier screen was negative. Rebekah [name] has had normal EKGs. She recently had episodes concerning for possible seizures (normal EEG during hospitalization) and is following with neurology. Genetic testing (epilepsy panel and microarray) are pending in this regard.  Rebekah [name]'s mother has rheumatoid arthritis. She follows with a cardiologist but has not had evidence of LQTS. Two of the mother's sisters have been diagnosed with LQTS, one of whom has had multiple cardiac arrests (triggered by startling sounds) and has an ICD. One of the mother's brothers has a son that had seizures as a toddler and a daughter who has a child diagnosed with LQTS. No one else in the family has had clinical genetic testing.   Rebekah [name]'s father died at 10 in his sleep of unknown causes. An autopsy was not performed. He was healthy prior to his death without any known heart concerns. Information regarding his family history is limited.  Physical Examination: No vitals obtained for this visit  General: Alert, interactive, in no acute distress Face: No dysmorphic facial features Heart: Appears well perfused Lungs: No increased work of breathing Neurologic: Normal tone, normal gait Psych: Normal mood and affect, appropriate interactions  Updated Genetic testing: [facility] Comprehensive Arrhythmia panel + add-on genes for prelim evidence + SUDEP   Pertinent New Labs: None  Pertinent New Imaging/Studies: None  Assessment: Rebekah [name] is a 37 y.o. female with long QT syndrome and ectopic atrial tachycardia. She had genetic evaluation initially over 10 years ago that only found a VUS in an unspecified gene. She came to our attention recently after having a baby who is being followed by Pediatric [name] given the strong family history of long QT syndrome. Her daughter has been asymptomatic. We suggested testing Rebekah [name] (rather than her daughter) to attempt to identify a potential genetic cause for the arrhythmias.  A  clear genetic cause of long QT syndrome and arrhythmia in Rebekah [name] was not identified. It is likely that there is a causative genetic variant present given her personal and family history that we are not yet able to identify. This could be because the variant is located in part of a gene that is not easily sequenced (intron) or because it is in a gene that has not as of yet been clearly linked to long QT syndrome. Further testing may be available in the future (such as a larger arrhythmia panel or genome sequencing).  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 It is unknown if Rebekah [name]'s particular variant causes symptoms or is normal benign variation. Until more is learned about the significance of this variant, it should not alter management. Pathogenic variants in MYL4 are associated with autosomal dominant and recessive atrial fibrillation. Specifically, it has been associated with the following findings: palpitations, paroxysmal atrial fibrillation (in some patients), permanent atrial fibrillation (in some patients), slow ventricular rate response, bradycardia (in some patients), very low amplitude P-waves, prolonged P-R interval, and reduced left atrial function. Sache's cardiologist can monitor for these findings and we ask that she let us know if these are identified. It is not currently associated with long QT.  Recommendations: Continue  routine [name] follow-up. MYL4 variant of uncertain significance could be linked to her atrial rhythm issues, but this finding should not change management at this time. No additional genetic testing at this time. More testing can be considered in the future as more genes are learned for arrhythmias or if genome sequencing becomes more readily available.   Charline BillsAimee Morrow, MS, [facility] Certified Genetic Counselor  Loletha Grayerose Acey Woodfield, D.O. Attending Physician Medical Genetics Date: 07/17/2022 Time: 2:42pm  Total time spent: 15 minutes Time spent  includes face to face and non-face to face care for the patient on the date of this encounter (history and physical, genetic counseling, coordination of care, data gathering and/or documentation as outlined)

## 2023-05-27 ENCOUNTER — Other Ambulatory Visit: Payer: Self-pay | Admitting: Medical Genetics

## 2023-07-16 ENCOUNTER — Other Ambulatory Visit: Payer: Self-pay

## 2023-07-16 DIAGNOSIS — Z006 Encounter for examination for normal comparison and control in clinical research program: Secondary | ICD-10-CM

## 2023-07-23 LAB — GENECONNECT MOLECULAR SCREEN: Genetic Analysis Overall Interpretation: NEGATIVE

## 2023-08-01 ENCOUNTER — Encounter: Payer: Self-pay | Admitting: Family Medicine

## 2023-08-02 ENCOUNTER — Encounter: Payer: Self-pay | Admitting: Family Medicine

## 2023-08-02 DIAGNOSIS — R109 Unspecified abdominal pain: Secondary | ICD-10-CM

## 2023-08-07 ENCOUNTER — Other Ambulatory Visit: Payer: Self-pay | Admitting: Family Medicine

## 2023-08-07 DIAGNOSIS — R109 Unspecified abdominal pain: Secondary | ICD-10-CM

## 2023-08-09 ENCOUNTER — Encounter: Payer: Self-pay | Admitting: Radiology

## 2023-08-09 ENCOUNTER — Ambulatory Visit
Admission: RE | Admit: 2023-08-09 | Discharge: 2023-08-09 | Disposition: A | Discharge status: Home / Self Care | Payer: PRIVATE HEALTH INSURANCE | Source: Ambulatory Visit | Attending: Family Medicine | Admitting: Family Medicine

## 2023-08-09 DIAGNOSIS — R109 Unspecified abdominal pain: Secondary | ICD-10-CM

## 2024-01-20 ENCOUNTER — Other Ambulatory Visit (HOSPITAL_BASED_OUTPATIENT_CLINIC_OR_DEPARTMENT_OTHER): Payer: Self-pay

## 2024-01-20 MED ORDER — FLUZONE 0.5 ML IM SUSY
0.5000 mL | PREFILLED_SYRINGE | Freq: Once | INTRAMUSCULAR | 0 refills | Status: AC
  Filled 2024-01-20: qty 0.5, 1d supply, fill #0
